# Patient Record
Sex: Male | Born: 1945 | Race: White | Hispanic: No | Marital: Married | Smoking: Former smoker
Health system: Southern US, Community
[De-identification: ages and names within clinical notes are randomized; demographics above are authoritative.]

## PROBLEM LIST (undated history)

## (undated) DIAGNOSIS — R519 Headache, unspecified: Secondary | ICD-10-CM

## (undated) DIAGNOSIS — S0990XA Unspecified injury of head, initial encounter: Secondary | ICD-10-CM

## (undated) HISTORY — PX: COLONOSCOPY: SHX174

## (undated) HISTORY — PX: EYE SURGERY: SHX253

---

## 2003-07-15 ENCOUNTER — Emergency Department (HOSPITAL_COMMUNITY): Admission: EM | Admit: 2003-07-15 | Discharge: 2003-07-15 | Payer: Self-pay | Admitting: Emergency Medicine

## 2003-07-16 ENCOUNTER — Ambulatory Visit (HOSPITAL_COMMUNITY): Admission: RE | Admit: 2003-07-16 | Discharge: 2003-07-16 | Payer: Self-pay | Admitting: Obstetrics and Gynecology

## 2006-04-24 ENCOUNTER — Ambulatory Visit (HOSPITAL_COMMUNITY): Admission: RE | Admit: 2006-04-24 | Discharge: 2006-04-24 | Payer: Self-pay | Admitting: Internal Medicine

## 2011-04-25 ENCOUNTER — Other Ambulatory Visit (HOSPITAL_COMMUNITY): Payer: Self-pay | Admitting: Neurosurgery

## 2011-04-25 DIAGNOSIS — M545 Low back pain: Secondary | ICD-10-CM

## 2011-04-28 ENCOUNTER — Ambulatory Visit (HOSPITAL_COMMUNITY)
Admission: RE | Admit: 2011-04-28 | Discharge: 2011-04-28 | Disposition: A | Discharge status: Home / Self Care | Payer: Medicare Other | Source: Ambulatory Visit | Attending: Neurosurgery | Admitting: Neurosurgery

## 2011-04-28 DIAGNOSIS — M5126 Other intervertebral disc displacement, lumbar region: Secondary | ICD-10-CM | POA: Insufficient documentation

## 2011-04-28 DIAGNOSIS — M51379 Other intervertebral disc degeneration, lumbosacral region without mention of lumbar back pain or lower extremity pain: Secondary | ICD-10-CM | POA: Insufficient documentation

## 2011-04-28 DIAGNOSIS — M5137 Other intervertebral disc degeneration, lumbosacral region: Secondary | ICD-10-CM | POA: Insufficient documentation

## 2011-04-28 DIAGNOSIS — M545 Low back pain, unspecified: Secondary | ICD-10-CM | POA: Insufficient documentation

## 2019-09-13 DIAGNOSIS — J189 Pneumonia, unspecified organism: Secondary | ICD-10-CM

## 2019-09-13 HISTORY — DX: Pneumonia, unspecified organism: J18.9

## 2019-09-26 DIAGNOSIS — Z0000 Encounter for general adult medical examination without abnormal findings: Secondary | ICD-10-CM | POA: Diagnosis not present

## 2019-09-26 DIAGNOSIS — E782 Mixed hyperlipidemia: Secondary | ICD-10-CM | POA: Diagnosis not present

## 2019-09-26 DIAGNOSIS — R7301 Impaired fasting glucose: Secondary | ICD-10-CM | POA: Diagnosis not present

## 2019-09-26 DIAGNOSIS — R030 Elevated blood-pressure reading, without diagnosis of hypertension: Secondary | ICD-10-CM | POA: Diagnosis not present

## 2019-09-26 DIAGNOSIS — H6123 Impacted cerumen, bilateral: Secondary | ICD-10-CM | POA: Diagnosis not present

## 2019-09-26 DIAGNOSIS — Z85828 Personal history of other malignant neoplasm of skin: Secondary | ICD-10-CM | POA: Diagnosis not present

## 2019-09-26 DIAGNOSIS — N401 Enlarged prostate with lower urinary tract symptoms: Secondary | ICD-10-CM | POA: Diagnosis not present

## 2019-09-26 DIAGNOSIS — N529 Male erectile dysfunction, unspecified: Secondary | ICD-10-CM | POA: Diagnosis not present

## 2019-09-26 DIAGNOSIS — R438 Other disturbances of smell and taste: Secondary | ICD-10-CM | POA: Diagnosis not present

## 2019-10-04 ENCOUNTER — Ambulatory Visit: Payer: Medicare Other | Attending: Internal Medicine

## 2019-10-04 DIAGNOSIS — Z23 Encounter for immunization: Secondary | ICD-10-CM | POA: Insufficient documentation

## 2019-10-04 NOTE — Progress Notes (Signed)
   Covid-19 Vaccination Clinic  Name:  JOBANNY MAVIS    MRN: [id number] DOB: Dec 01, 1945  10/04/2019  Mr. Greenlaw was observed post Covid-19 immunization for 15 minutes without incidence. He was provided with Vaccine Information Sheet and instruction to access the V-Safe system.   Mr. Patras was instructed to call 911 with any severe reactions post vaccine: Marland Kitchen Difficulty breathing  . Swelling of your face and throat  . A fast heartbeat  . A bad rash all over your body  . Dizziness and weakness    Immunizations Administered    Name Date Dose VIS Date Route   Pfizer COVID-19 Vaccine 10/04/2019 11:10 AM 0.3 mL 08/23/2019 Intramuscular   Manufacturer: [employer], [employer]   Lot: [id number]   NDC: [address]-8

## 2019-10-25 ENCOUNTER — Ambulatory Visit: Payer: Medicare PPO | Attending: Internal Medicine

## 2019-10-25 DIAGNOSIS — Z23 Encounter for immunization: Secondary | ICD-10-CM | POA: Insufficient documentation

## 2019-10-25 NOTE — Progress Notes (Signed)
   Covid-19 Vaccination Clinic  Name:  Luis Carr    MRN: [id number] DOB: 05-Feb-1946  10/25/2019  [name] was observed post Covid-19 immunization for 15 minutes without incidence. He was provided with Vaccine Information Sheet and instruction to access the V-Safe system.   [name] was instructed to call 911 with any severe reactions post vaccine: Marland Kitchen Difficulty breathing  . Swelling of your face and throat  . A fast heartbeat  . A bad rash all over your body  . Dizziness and weakness    Immunizations Administered    Name Date Dose VIS Date Route   Pfizer COVID-19 Vaccine 10/25/2019 11:05 AM 0.3 mL 08/23/2019 Intramuscular   Manufacturer: [employer], [employer]   Lot: [id number]   NDC: [address]-3

## 2020-01-31 DIAGNOSIS — M545 Low back pain: Secondary | ICD-10-CM | POA: Diagnosis not present

## 2020-01-31 DIAGNOSIS — N529 Male erectile dysfunction, unspecified: Secondary | ICD-10-CM | POA: Diagnosis not present

## 2020-01-31 DIAGNOSIS — H6123 Impacted cerumen, bilateral: Secondary | ICD-10-CM | POA: Diagnosis not present

## 2020-01-31 DIAGNOSIS — E782 Mixed hyperlipidemia: Secondary | ICD-10-CM | POA: Diagnosis not present

## 2020-01-31 DIAGNOSIS — R030 Elevated blood-pressure reading, without diagnosis of hypertension: Secondary | ICD-10-CM | POA: Diagnosis not present

## 2020-01-31 DIAGNOSIS — Z0000 Encounter for general adult medical examination without abnormal findings: Secondary | ICD-10-CM | POA: Diagnosis not present

## 2020-01-31 DIAGNOSIS — R438 Other disturbances of smell and taste: Secondary | ICD-10-CM | POA: Diagnosis not present

## 2020-01-31 DIAGNOSIS — N401 Enlarged prostate with lower urinary tract symptoms: Secondary | ICD-10-CM | POA: Diagnosis not present

## 2020-02-04 DIAGNOSIS — N529 Male erectile dysfunction, unspecified: Secondary | ICD-10-CM | POA: Diagnosis not present

## 2020-02-04 DIAGNOSIS — Z6825 Body mass index (BMI) 25.0-25.9, adult: Secondary | ICD-10-CM | POA: Diagnosis not present

## 2020-02-04 DIAGNOSIS — M545 Low back pain: Secondary | ICD-10-CM | POA: Diagnosis not present

## 2020-02-04 DIAGNOSIS — E782 Mixed hyperlipidemia: Secondary | ICD-10-CM | POA: Diagnosis not present

## 2020-02-04 DIAGNOSIS — N401 Enlarged prostate with lower urinary tract symptoms: Secondary | ICD-10-CM | POA: Diagnosis not present

## 2020-02-04 DIAGNOSIS — R7301 Impaired fasting glucose: Secondary | ICD-10-CM | POA: Diagnosis not present

## 2020-02-04 DIAGNOSIS — Z0001 Encounter for general adult medical examination with abnormal findings: Secondary | ICD-10-CM | POA: Diagnosis not present

## 2020-02-04 DIAGNOSIS — Z85828 Personal history of other malignant neoplasm of skin: Secondary | ICD-10-CM | POA: Diagnosis not present

## 2020-02-04 DIAGNOSIS — R030 Elevated blood-pressure reading, without diagnosis of hypertension: Secondary | ICD-10-CM | POA: Diagnosis not present

## 2020-04-12 ENCOUNTER — Emergency Department (HOSPITAL_COMMUNITY)
Admission: EM | Admit: 2020-04-12 | Discharge: 2020-04-12 | Disposition: A | Discharge status: Home / Self Care | Payer: Medicare PPO | Attending: Emergency Medicine | Admitting: Emergency Medicine

## 2020-04-12 ENCOUNTER — Emergency Department (HOSPITAL_COMMUNITY): Payer: Medicare PPO

## 2020-04-12 ENCOUNTER — Other Ambulatory Visit: Payer: Self-pay

## 2020-04-12 DIAGNOSIS — Z5321 Procedure and treatment not carried out due to patient leaving prior to being seen by health care provider: Secondary | ICD-10-CM | POA: Diagnosis not present

## 2020-04-12 DIAGNOSIS — R05 Cough: Secondary | ICD-10-CM | POA: Diagnosis not present

## 2020-04-12 DIAGNOSIS — R079 Chest pain, unspecified: Secondary | ICD-10-CM | POA: Insufficient documentation

## 2020-04-12 DIAGNOSIS — J189 Pneumonia, unspecified organism: Secondary | ICD-10-CM | POA: Diagnosis not present

## 2020-04-12 LAB — CBC
HCT: 42.5 % (ref 39.0–52.0)
Hemoglobin: 14.4 g/dL (ref 13.0–17.0)
MCH: 31 pg (ref 26.0–34.0)
MCHC: 33.9 g/dL (ref 30.0–36.0)
MCV: 91.4 fL (ref 80.0–100.0)
Platelets: 306 10*3/uL (ref 150–400)
RBC: 4.65 MIL/uL (ref 4.22–5.81)
RDW: 13.1 % (ref 11.5–15.5)
WBC: 5.7 10*3/uL (ref 4.0–10.5)
nRBC: 0 % (ref 0.0–0.2)

## 2020-04-12 LAB — BASIC METABOLIC PANEL
Anion gap: 9 (ref 5–15)
BUN: 17 mg/dL (ref 8–23)
CO2: 26 mmol/L (ref 22–32)
Calcium: 9.3 mg/dL (ref 8.9–10.3)
Chloride: 104 mmol/L (ref 98–111)
Creatinine, Ser: 0.78 mg/dL (ref 0.61–1.24)
GFR calc Af Amer: >60 mL/min (ref >60)
GFR calc non Af Amer: >60 mL/min (ref >60)
Glucose, Bld: 125 mg/dL — ABNORMAL HIGH (ref 70–99)
Potassium: 4 mmol/L (ref 3.5–5.1)
Sodium: 139 mmol/L (ref 135–145)

## 2020-04-12 LAB — TROPONIN I (HIGH SENSITIVITY): Troponin I (High Sensitivity): 4 ng/L (ref <18)

## 2020-04-12 NOTE — ED Triage Notes (Signed)
Per patient, he developed central chest pain around 2 hours ago. Denies radiation. Patient says he has been coughing up white frothy sputum also. Pain rated 10/10

## 2020-04-14 DIAGNOSIS — J189 Pneumonia, unspecified organism: Secondary | ICD-10-CM | POA: Diagnosis not present

## 2020-04-16 DIAGNOSIS — J189 Pneumonia, unspecified organism: Secondary | ICD-10-CM | POA: Diagnosis not present

## 2020-04-16 DIAGNOSIS — R21 Rash and other nonspecific skin eruption: Secondary | ICD-10-CM | POA: Diagnosis not present

## 2020-04-16 DIAGNOSIS — J690 Pneumonitis due to inhalation of food and vomit: Secondary | ICD-10-CM | POA: Diagnosis not present

## 2020-05-07 ENCOUNTER — Other Ambulatory Visit: Payer: Self-pay

## 2020-05-07 ENCOUNTER — Other Ambulatory Visit: Payer: Self-pay | Admitting: Internal Medicine

## 2020-05-07 ENCOUNTER — Ambulatory Visit (HOSPITAL_COMMUNITY)
Admission: RE | Admit: 2020-05-07 | Discharge: 2020-05-07 | Disposition: A | Discharge status: Home / Self Care | Payer: Medicare PPO | Source: Ambulatory Visit | Attending: Internal Medicine | Admitting: Internal Medicine

## 2020-05-07 ENCOUNTER — Other Ambulatory Visit (HOSPITAL_COMMUNITY): Payer: Self-pay | Admitting: Internal Medicine

## 2020-05-07 DIAGNOSIS — Z8701 Personal history of pneumonia (recurrent): Secondary | ICD-10-CM | POA: Diagnosis not present

## 2020-05-07 DIAGNOSIS — R05 Cough: Secondary | ICD-10-CM | POA: Insufficient documentation

## 2020-05-07 DIAGNOSIS — R059 Cough, unspecified: Secondary | ICD-10-CM

## 2020-05-07 DIAGNOSIS — R918 Other nonspecific abnormal finding of lung field: Secondary | ICD-10-CM | POA: Diagnosis not present

## 2020-05-14 DIAGNOSIS — R918 Other nonspecific abnormal finding of lung field: Secondary | ICD-10-CM | POA: Diagnosis not present

## 2020-05-14 DIAGNOSIS — R490 Dysphonia: Secondary | ICD-10-CM | POA: Diagnosis not present

## 2020-05-14 DIAGNOSIS — J690 Pneumonitis due to inhalation of food and vomit: Secondary | ICD-10-CM | POA: Diagnosis not present

## 2020-05-15 ENCOUNTER — Other Ambulatory Visit (HOSPITAL_COMMUNITY): Payer: Self-pay | Admitting: Internal Medicine

## 2020-05-15 DIAGNOSIS — R059 Cough, unspecified: Secondary | ICD-10-CM

## 2020-05-21 ENCOUNTER — Other Ambulatory Visit: Payer: Self-pay | Admitting: Internal Medicine

## 2020-05-21 ENCOUNTER — Other Ambulatory Visit (HOSPITAL_COMMUNITY): Payer: Self-pay | Admitting: Internal Medicine

## 2020-05-21 DIAGNOSIS — R918 Other nonspecific abnormal finding of lung field: Secondary | ICD-10-CM

## 2020-05-29 ENCOUNTER — Ambulatory Visit (HOSPITAL_COMMUNITY)
Admission: RE | Admit: 2020-05-29 | Discharge: 2020-05-29 | Disposition: A | Discharge status: Home / Self Care | Payer: Medicare PPO | Source: Ambulatory Visit | Attending: Internal Medicine | Admitting: Internal Medicine

## 2020-05-29 ENCOUNTER — Other Ambulatory Visit: Payer: Self-pay

## 2020-05-29 DIAGNOSIS — I2510 Atherosclerotic heart disease of native coronary artery without angina pectoris: Secondary | ICD-10-CM | POA: Diagnosis not present

## 2020-05-29 DIAGNOSIS — R918 Other nonspecific abnormal finding of lung field: Secondary | ICD-10-CM

## 2020-05-29 DIAGNOSIS — I700 Atherosclerosis of aorta: Secondary | ICD-10-CM | POA: Diagnosis not present

## 2020-05-29 DIAGNOSIS — K449 Diaphragmatic hernia without obstruction or gangrene: Secondary | ICD-10-CM | POA: Diagnosis not present

## 2020-06-12 ENCOUNTER — Encounter: Payer: Medicare PPO | Admitting: Thoracic Surgery (Cardiothoracic Vascular Surgery)

## 2020-06-19 ENCOUNTER — Telehealth: Payer: Self-pay | Admitting: *Deleted

## 2020-06-19 NOTE — Telephone Encounter (Signed)
left vm to call our office to r/s missed appointment

## 2020-06-26 ENCOUNTER — Other Ambulatory Visit: Payer: Self-pay

## 2020-06-26 ENCOUNTER — Institutional Professional Consult (permissible substitution): Payer: Medicare PPO | Admitting: Thoracic Surgery (Cardiothoracic Vascular Surgery)

## 2020-06-26 ENCOUNTER — Encounter: Payer: Self-pay | Admitting: Thoracic Surgery (Cardiothoracic Vascular Surgery)

## 2020-06-26 VITALS — BP 157/77 | HR 77 | Resp 18 | Ht 73.0 in | Wt 184.6 lb

## 2020-06-26 DIAGNOSIS — K449 Diaphragmatic hernia without obstruction or gangrene: Secondary | ICD-10-CM | POA: Diagnosis not present

## 2020-06-26 NOTE — Progress Notes (Signed)
[address]       [place] [address]             [phone]                    [facility] Medical Record #[id number] Date of Birth: 07-Apr-1946  Referring: Benita Stabile, MD Primary Care: Benita Stabile, MD Primary Cardiologist: No primary care provider on file.  Chief Complaint:    Chief Complaint  Patient presents with  . Diaphragmatic Hernia    Surgical consult, Chest CT 05/31/2020    History of Present Illness:    Luis Carr 74 y.o. male presents for surgical evaluation of a right diaphragmatic hernia.  This was found incidentally.  In August of this year he was treated in the emergency department for a presumed pneumonia and chest x-ray showed a right-sided opacity.  He subsequently underwent a CT scan a month later and that showed right-sided diaphragmatic hernia with intestine herniating into his right thoracic space.  From a symptomatic standpoint he denies any chest or back pain.  He denies any abdominal pain no constipation.  He also denies any nausea or vomiting.  He has no recent or remote history of any blunt or penetrating trauma outside of a ground-level fall that he experienced in 1994.  He states that he fell on his sacrum but required no surgical intervention for that.  From a respiratory standpoint he has had some increased sputum production this is persisted since his pneumonia.    No past medical history on file.   No family history on file.   Social History   Tobacco Use  Smoking Status Not on file    Social History   Substance and Sexual Activity  Alcohol Use Not on file     No Known Allergies  Current Outpatient Medications  Medication Sig Dispense Refill  . amoxicillin-clavulanate (AUGMENTIN) 875-125 MG tablet Take 1 tablet by mouth 2 (two) times daily.    . Coenzyme Q-10 200 MG CAPS Take 200 mg by mouth.    . finasteride (PROSCAR) 5 MG tablet     . Omega-3 Fatty Acids (FISH OIL) 1000 MG CAPS  Take 1,000 mg by mouth once.    . pravastatin (PRAVACHOL) 10 MG tablet Take 10 mg by mouth daily.    . sildenafil (VIAGRA) 100 MG tablet Take 100 mg by mouth daily as needed for erectile dysfunction.    . tamsulosin (FLOMAX) 0.4 MG CAPS capsule      No current facility-administered medications for this visit.    Review of Systems  Constitutional: Negative.   Respiratory: Positive for sputum production.   Cardiovascular: Negative.   Gastrointestinal: Negative.   Neurological: Negative.     PHYSICAL EXAMINATION: BP (!) 157/77 (BP Location: Right Arm, Patient Position: Sitting)   Pulse 77   Resp 18   Ht 6\' 1"  (1.854 m)   Wt 184 lb 9.6 oz (83.7 kg)   SpO2 94% Comment: RA with mask on  BMI 24.36 kg/m   Physical Exam Constitutional:      General: He is not in acute distress.    Appearance: Normal appearance. He is not ill-appearing, toxic-appearing or diaphoretic.  Eyes:     Extraocular Movements: Extraocular movements intact.     Conjunctiva/sclera: Conjunctivae normal.  Cardiovascular:     Rate and Rhythm: Normal rate.  Pulmonary:     Effort: Pulmonary effort is normal. No  respiratory distress.  Musculoskeletal:        General: Normal range of motion.     Cervical back: Normal range of motion.  Neurological:     General: No focal deficit present.     Mental Status: He is alert and oriented to person, place, and time.      Diagnostic Studies & Laboratory data:     Recent Radiology Findings:   CT CHEST WO CONTRAST  Result Date: 05/31/2020 CLINICAL DATA:  Abnormal finding on chest x-ray right lung base. EXAM: CT CHEST WITHOUT CONTRAST TECHNIQUE: Multidetector CT imaging of the chest was performed following the standard protocol without IV contrast. COMPARISON:  Chest x-ray 05/07/2020 and chest CT 07/16/2003 FINDINGS: Cardiovascular: Heart is normal size. Calcified plaque is present over the left anterior descending and right coronary arteries. Thoracic aorta is normal in  caliber. There is mild calcified plaque over the descending thoracic aorta. Remaining vascular structures are unremarkable. Mediastinum/Nodes: No significant hilar or mediastinal adenopathy. Remaining mediastinal structures are normal. Lungs/Pleura: Left lung is normally inflated. Mild volume loss of the right lung due to anterior right diaphragmatic hernia containing loop of colon. This accounts for the chest radiograph abnormality. No underlying pulmonary disease in the right lung base. No airspace process or effusion. Airways are normal. Upper Abdomen: Calcified plaque over the abdominal aorta. No acute findings. Musculoskeletal: Degenerative change of the spine. IMPRESSION: 1. No acute cardiopulmonary disease. 2. Anterior right diaphragmatic hernia containing loop of colon with associated mild volume loss of the right lung. This accounts for the chest radiograph abnormality. 3. Aortic atherosclerosis. Atherosclerotic coronary artery disease. Aortic Atherosclerosis (ICD10-I70.0). Electronically Signed   By: Elberta Fortis M.D.   On: 05/31/2020 10:45       I have independently reviewed the above radiology studies  and reviewed the findings with the patient.   Recent Lab Findings: Lab Results  Component Value Date   WBC 5.7 04/12/2020   HGB 14.4 04/12/2020   HCT 42.5 04/12/2020   PLT 306 04/12/2020   GLUCOSE 125 (H) 04/12/2020   NA 139 04/12/2020   K 4.0 04/12/2020   CL 104 04/12/2020   CREATININE 0.78 04/12/2020   BUN 17 04/12/2020   CO2 26 04/12/2020       Problem List: Diaphragmatic hernia Incarcerated bowel in the right chest.  Assessment / Plan:   This is a 74 year old male that presents with a diaphragmatic hernia along with incarcerated bowel into the right chest.  I personally reviewed his scan from September 2021 as well as a CT scan from 2004.  On the 2004 scan there appeared to be a right-sided opacity consistent with omentum that was herniated through a potential  diaphragmatic defect.  On the most recent scan of this year the hernia is much bigger with a good component of bowel herniating into his right chest.  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  He currently is asymptomatic but given the fact that this hernia is enlarging over the last 17 years I have recommended that he undergo a robotic assisted laparoscopy with the diaphragmatic hernia repair.  We discussed the potential of requiring mesh for the closure.  He would like some time to discuss surgical options with his wife.  He will call us back to let us know his final decision.      [name] 06/26/2020 3:46 PM

## 2020-06-29 ENCOUNTER — Other Ambulatory Visit: Payer: Self-pay | Admitting: *Deleted

## 2020-06-29 DIAGNOSIS — K449 Diaphragmatic hernia without obstruction or gangrene: Secondary | ICD-10-CM

## 2020-07-02 ENCOUNTER — Encounter (HOSPITAL_COMMUNITY): Payer: Self-pay

## 2020-07-02 ENCOUNTER — Other Ambulatory Visit: Payer: Self-pay

## 2020-07-02 ENCOUNTER — Encounter (HOSPITAL_COMMUNITY)
Admission: RE | Admit: 2020-07-02 | Discharge: 2020-07-02 | Disposition: A | Discharge status: Home / Self Care | Payer: Medicare PPO | Source: Ambulatory Visit | Attending: Thoracic Surgery (Cardiothoracic Vascular Surgery) | Admitting: Thoracic Surgery (Cardiothoracic Vascular Surgery)

## 2020-07-02 ENCOUNTER — Ambulatory Visit (HOSPITAL_COMMUNITY)
Admission: RE | Admit: 2020-07-02 | Discharge: 2020-07-02 | Disposition: A | Discharge status: Home / Self Care | Payer: Medicare PPO | Source: Ambulatory Visit | Attending: Thoracic Surgery (Cardiothoracic Vascular Surgery) | Admitting: Thoracic Surgery (Cardiothoracic Vascular Surgery)

## 2020-07-02 ENCOUNTER — Other Ambulatory Visit (HOSPITAL_COMMUNITY)
Admission: RE | Admit: 2020-07-02 | Discharge: 2020-07-02 | Disposition: A | Discharge status: Home / Self Care | Payer: Medicare PPO | Source: Ambulatory Visit | Attending: Thoracic Surgery (Cardiothoracic Vascular Surgery) | Admitting: Thoracic Surgery (Cardiothoracic Vascular Surgery)

## 2020-07-02 DIAGNOSIS — K449 Diaphragmatic hernia without obstruction or gangrene: Secondary | ICD-10-CM | POA: Insufficient documentation

## 2020-07-02 DIAGNOSIS — Z20822 Contact with and (suspected) exposure to covid-19: Secondary | ICD-10-CM | POA: Diagnosis not present

## 2020-07-02 DIAGNOSIS — R918 Other nonspecific abnormal finding of lung field: Secondary | ICD-10-CM | POA: Diagnosis not present

## 2020-07-02 HISTORY — DX: Unspecified injury of head, initial encounter: S09.90XA

## 2020-07-02 HISTORY — DX: Headache, unspecified: R51.9

## 2020-07-02 LAB — CBC
HCT: 41.2 % (ref 39.0–52.0)
Hemoglobin: 13.9 g/dL (ref 13.0–17.0)
MCH: 31.6 pg (ref 26.0–34.0)
MCHC: 33.7 g/dL (ref 30.0–36.0)
MCV: 93.6 fL (ref 80.0–100.0)
Platelets: 241 10*3/uL (ref 150–400)
RBC: 4.4 MIL/uL (ref 4.22–5.81)
RDW: 13.3 % (ref 11.5–15.5)
WBC: 4.5 10*3/uL (ref 4.0–10.5)
nRBC: 0 % (ref 0.0–0.2)

## 2020-07-02 LAB — URINALYSIS, ROUTINE W REFLEX MICROSCOPIC
Bilirubin Urine: NEGATIVE
Glucose, UA: NEGATIVE mg/dL
Hgb urine dipstick: NEGATIVE
Ketones, ur: NEGATIVE mg/dL
Leukocytes,Ua: NEGATIVE
Nitrite: NEGATIVE
Protein, ur: NEGATIVE mg/dL
Specific Gravity, Urine: 1.019 (ref 1.005–1.030)
pH: 5 (ref 5.0–8.0)

## 2020-07-02 LAB — COMPREHENSIVE METABOLIC PANEL
ALT: 15 U/L (ref 0–44)
AST: 25 U/L (ref 15–41)
Albumin: 4 g/dL (ref 3.5–5.0)
Alkaline Phosphatase: 44 U/L (ref 38–126)
Anion gap: 11 (ref 5–15)
BUN: 15 mg/dL (ref 8–23)
CO2: 20 mmol/L — ABNORMAL LOW (ref 22–32)
Calcium: 9.1 mg/dL (ref 8.9–10.3)
Chloride: 106 mmol/L (ref 98–111)
Creatinine, Ser: 0.81 mg/dL (ref 0.61–1.24)
GFR, Estimated: >60 mL/min (ref >60)
Glucose, Bld: 123 mg/dL — ABNORMAL HIGH (ref 70–99)
Potassium: 4 mmol/L (ref 3.5–5.1)
Sodium: 137 mmol/L (ref 135–145)
Total Bilirubin: 1.3 mg/dL — ABNORMAL HIGH (ref 0.3–1.2)
Total Protein: 6.8 g/dL (ref 6.5–8.1)

## 2020-07-02 LAB — BLOOD GAS, ARTERIAL
Acid-base deficit: 1.1 mmol/L (ref 0.0–2.0)
Bicarbonate: 22.7 mmol/L (ref 20.0–28.0)
FIO2: 21
O2 Saturation: 97.4 %
Patient temperature: 37
pCO2 arterial: 35 mmHg (ref 32.0–48.0)
pH, Arterial: 7.427 (ref 7.350–7.450)
pO2, Arterial: 94.3 mmHg (ref 83.0–108.0)

## 2020-07-02 LAB — SARS CORONAVIRUS 2 (TAT 6-24 HRS): SARS Coronavirus 2: NEGATIVE

## 2020-07-02 LAB — SURGICAL PCR SCREEN
MRSA, PCR: NEGATIVE
Staphylococcus aureus: NEGATIVE

## 2020-07-02 LAB — TYPE AND SCREEN
ABO/RH(D): O POS
Antibody Screen: NEGATIVE

## 2020-07-02 LAB — PROTIME-INR
INR: 1 (ref 0.8–1.2)
Prothrombin Time: 13 seconds (ref 11.4–15.2)

## 2020-07-02 LAB — APTT: aPTT: 29 seconds (ref 24–36)

## 2020-07-02 NOTE — Pre-Procedure Instructions (Signed)
Luis Carr  07/02/2020       Your procedure is scheduled on Monday, October 25.  Report to [facility], Main Entrance or Entrance "A" at 5:30 A.M.                Your surgery or procedure is scheduled to begin at 7:30 AM   Call this number if you have problems the morning of surgery: [phone]  This is the number for the Pre- Surgical Desk.                For any other questions, please call [phone], Monday - Friday 8 AM - 4 PM.   Remember:  Do not eat or drink after midnight Sunday night.   Take these medicines the morning of surgery with A SIP OF WATER: finasteride (PROSCAR)   1 Week prior to surgery STOP taking Aspirin, Aspirin Products (Goody Powder, Excedrin Migraine), Ibuprofen (Advil), Naproxen (Aleve), Vitamins and Herbal Products (ie Fish Oil).  STOP taking Aspirin, Aspirin Products (Goody Powder, Excedrin Migraine), Ibuprofen (Advil), Naproxen (Aleve), Vitamins and Herbal Products (ie Fish Oil).    Special instructions:    [place]- Preparing For Surgery  Before surgery, you can play an important role. Because skin is not sterile, your skin needs to be as free of germs as possible. You can reduce the number of germs on your skin by washing with CHG (chlorahexidine gluconate) Soap before surgery.  CHG is an antiseptic cleaner which kills germs and bonds with the skin to continue killing germs even after washing.    Oral Hygiene is also important to reduce your risk of infection.  Remember - BRUSH YOUR TEETH THE MORNING OF SURGERY WITH YOUR REGULAR TOOTHPASTE  Please do not use if you have an allergy to CHG or antibacterial soaps. If your skin becomes reddened/irritated stop using the CHG.  Do not shave (including legs and underarms) for at least 48 hours prior to first CHG shower. It is OK to shave your face.  Please follow these instructions carefully.   1. Shower the NIGHT BEFORE SURGERY and the MORNING OF SURGERY with CHG.   2. If you  chose to wash your hair, wash your hair first as usual with your normal shampoo.  3. After you shampoo, wash your face and private area with the soap you use at home, then rinse your hair and body thoroughly to remove the shampoo and soap.  4. Use CHG as you would any other liquid soap. You can apply CHG directly to the skin and wash gently with a scrungie or a clean washcloth.   5. Apply the CHG Soap to your body ONLY FROM THE NECK DOWN.  Do not use on open wounds or open sores. Avoid contact with your eyes, ears, mouth and genitals (private parts).   6. Wash thoroughly, paying special attention to the area where your surgery will be performed.  7. Thoroughly rinse your body with warm water from the neck down.  8. DO NOT shower/wash with your normal soap after using and rinsing off the CHG Soap.  9. Pat yourself dry with a CLEAN TOWEL.  10. Wear CLEAN PAJAMAS to bed the night before surgery, wear comfortable clothes the morning of surgery  11. Place CLEAN SHEETS on your bed the night of your first shower and DO NOT SLEEP WITH PETS.  Day of Surgery: Shower as instructed above. Do not apply any deodorants/lotions, powders or colognes.  Please wear clean  clothes to the hospital/surgery center.   Remember to brush your teeth WITH YOUR REGULAR TOOTHPASTE.  Do not wear jewelry, make-up or nail polish.  Do not shave 48 hours prior to surgery.  Men may shave face and neck.  Do not bring valuables to the hospital.  [facility] is not responsible for any belongings or valuables.  Contacts, dentures or bridgework may not be worn into surgery.  Leave your suitcase in the car.  After surgery it may be brought to your room.  For patients admitted to the hospital, discharge time will be determined by your treatment team.  Patients discharged the day of surgery will not be allowed to drive home.   Please read over the fact sheets that you were given.

## 2020-07-02 NOTE — Progress Notes (Signed)
PCP - Dr, Evette Doffing  Cardiologist -   Chest x-ray - 07/02/20  EKG - 07/02/20  Stress Test -    ECHO -   Cardiac Cath -   Sleep Study - no CPAP - no  LABS-CBC, CMP, PT, PTT,  UA, T/S, PCR.  ASA- last dose 06/24/20  ERAS-no  HA1C-NA Fasting Blood Sugar - NA Checks Blood Sugar NA times a day  Anesthesia-  Pt denies having chest pain, sob, or fever at this time. All instructions explained to the pt, with a verbal understanding of the material. Pt agrees to go over the instructions while at home for a better understanding. Pt also instructed to self quarantine after being tested for COVID-19. The opportunity to ask questions was provided.

## 2020-07-05 NOTE — Anesthesia Preprocedure Evaluation (Addendum)
Anesthesia Evaluation  Patient identified by MRN, date of birth, ID band Patient awake    Reviewed: Allergy & Precautions, NPO status , Patient's Chart, lab work & pertinent test results  Airway Mallampati: II  TM Distance: >3 FB Neck ROM: Full    Dental  (+) Dental Advisory Given   Pulmonary former smoker,    breath sounds clear to auscultation       Cardiovascular negative cardio ROS   Rhythm:Regular Rate:Normal     Neuro/Psych negative neurological ROS     GI/Hepatic negative GI ROS, Neg liver ROS,   Endo/Other  negative endocrine ROS  Renal/GU negative Renal ROS     Musculoskeletal   Abdominal   Peds  Hematology negative hematology ROS (+)   Anesthesia Other Findings   Reproductive/Obstetrics                            Lab Results  Component Value Date   WBC 4.5 07/02/2020   HGB 13.9 07/02/2020   HCT 41.2 07/02/2020   MCV 93.6 07/02/2020   PLT 241 07/02/2020   Lab Results  Component Value Date   CREATININE 0.81 07/02/2020   BUN 15 07/02/2020   NA 137 07/02/2020   K 4.0 07/02/2020   CL 106 07/02/2020   CO2 20 (L) 07/02/2020    Anesthesia Physical Anesthesia Plan  ASA: I  Anesthesia Plan: General   Post-op Pain Management:    Induction: Intravenous  PONV Risk Score and Plan: 3 and Dexamethasone, Ondansetron and Treatment may vary due to age or medical condition  Airway Management Planned: Oral ETT and Double Lumen EBT  Additional Equipment: Arterial line  Intra-op Plan:   Post-operative Plan: Extubation in OR  Informed Consent: I have reviewed the patients History and Physical, chart, labs and discussed the procedure including the risks, benefits and alternatives for the proposed anesthesia with the patient or authorized representative who has indicated his/her understanding and acceptance.     Dental advisory given  Plan Discussed with:  CRNA  Anesthesia Plan Comments:        Anesthesia Quick Evaluation

## 2020-07-06 ENCOUNTER — Other Ambulatory Visit: Payer: Self-pay

## 2020-07-06 ENCOUNTER — Ambulatory Visit (HOSPITAL_COMMUNITY): Payer: Medicare PPO | Admitting: Anesthesiology

## 2020-07-06 ENCOUNTER — Observation Stay (HOSPITAL_COMMUNITY): Payer: Medicare PPO

## 2020-07-06 ENCOUNTER — Encounter (HOSPITAL_COMMUNITY): Payer: Self-pay | Admitting: Thoracic Surgery (Cardiothoracic Vascular Surgery)

## 2020-07-06 ENCOUNTER — Encounter (HOSPITAL_COMMUNITY)
Admission: RE | Disposition: A | Discharge status: Home / Self Care | Payer: Self-pay | Source: Home / Self Care | Attending: Thoracic Surgery (Cardiothoracic Vascular Surgery)

## 2020-07-06 ENCOUNTER — Observation Stay (HOSPITAL_COMMUNITY)
Admission: RE | Admit: 2020-07-06 | Discharge: 2020-07-07 | Disposition: A | Discharge status: Home / Self Care | Payer: Medicare PPO | Attending: Thoracic Surgery (Cardiothoracic Vascular Surgery) | Admitting: Thoracic Surgery (Cardiothoracic Vascular Surgery)

## 2020-07-06 DIAGNOSIS — J9811 Atelectasis: Secondary | ICD-10-CM | POA: Diagnosis not present

## 2020-07-06 DIAGNOSIS — K449 Diaphragmatic hernia without obstruction or gangrene: Secondary | ICD-10-CM | POA: Diagnosis not present

## 2020-07-06 DIAGNOSIS — Z09 Encounter for follow-up examination after completed treatment for conditions other than malignant neoplasm: Secondary | ICD-10-CM

## 2020-07-06 DIAGNOSIS — K440 Diaphragmatic hernia with obstruction, without gangrene: Secondary | ICD-10-CM | POA: Diagnosis not present

## 2020-07-06 HISTORY — PX: XI ROBOTIC ASSISTED REPAIR OF DIAPHRAGMATIC HERNIA: SHX6865

## 2020-07-06 LAB — CBC
HCT: 37.7 % — ABNORMAL LOW (ref 39.0–52.0)
Hemoglobin: 12.7 g/dL — ABNORMAL LOW (ref 13.0–17.0)
MCH: 31.3 pg (ref 26.0–34.0)
MCHC: 33.7 g/dL (ref 30.0–36.0)
MCV: 92.9 fL (ref 80.0–100.0)
Platelets: 246 10*3/uL (ref 150–400)
RBC: 4.06 MIL/uL — ABNORMAL LOW (ref 4.22–5.81)
RDW: 13.2 % (ref 11.5–15.5)
WBC: 8.9 10*3/uL (ref 4.0–10.5)
nRBC: 0 % (ref 0.0–0.2)

## 2020-07-06 LAB — ABO/RH: ABO/RH(D): O POS

## 2020-07-06 SURGERY — REPAIR, HERNIA, DIAPHRAGMATIC, ROBOT-ASSISTED
Anesthesia: General

## 2020-07-06 MED ORDER — BUPIVACAINE LIPOSOME 1.3 % IJ SUSP
20.0000 mL | Freq: Once | INTRAMUSCULAR | Status: DC
  Filled 2020-07-06: qty 20

## 2020-07-06 MED ORDER — KETOROLAC TROMETHAMINE 30 MG/ML IJ SOLN
INTRAMUSCULAR | Status: DC | PRN
  Administered 2020-07-06: 30 mg via INTRAVENOUS

## 2020-07-06 MED ORDER — LACTATED RINGERS IV SOLN: INTRAVENOUS | Status: DC

## 2020-07-06 MED ORDER — SUGAMMADEX SODIUM 200 MG/2ML IV SOLN
INTRAVENOUS | Status: DC | PRN
  Administered 2020-07-06: 170 mg via INTRAVENOUS

## 2020-07-06 MED ORDER — AMISULPRIDE (ANTIEMETIC) 5 MG/2ML IV SOLN: 10.0000 mg | Freq: Once | INTRAVENOUS | Status: DC | PRN

## 2020-07-06 MED ORDER — KETOROLAC TROMETHAMINE 15 MG/ML IJ SOLN
15.0000 mg | Freq: Four times a day (QID) | INTRAMUSCULAR | Status: DC
  Administered 2020-07-06 – 2020-07-07 (×3): 15 mg via INTRAVENOUS
  Filled 2020-07-06 (×3): qty 1

## 2020-07-06 MED ORDER — BUPIVACAINE HCL (PF) 0.5 % IJ SOLN
INTRAMUSCULAR | Status: AC
  Filled 2020-07-06: qty 30

## 2020-07-06 MED ORDER — BUPIVACAINE LIPOSOME 1.3 % IJ SUSP
INTRAMUSCULAR | Status: DC | PRN
  Administered 2020-07-06: 50 mL

## 2020-07-06 MED ORDER — PROPOFOL 10 MG/ML IV BOLUS
INTRAVENOUS | Status: DC | PRN
  Administered 2020-07-06: 110 mg via INTRAVENOUS

## 2020-07-06 MED ORDER — ACETAMINOPHEN 500 MG PO TABS
1000.0000 mg | ORAL_TABLET | Freq: Once | ORAL | Status: AC
  Administered 2020-07-06: 1000 mg via ORAL
  Filled 2020-07-06: qty 2

## 2020-07-06 MED ORDER — ACETAMINOPHEN 500 MG PO TABS
1000.0000 mg | ORAL_TABLET | Freq: Four times a day (QID) | ORAL | Status: DC
  Administered 2020-07-06 – 2020-07-07 (×3): 1000 mg via ORAL
  Filled 2020-07-06 (×4): qty 2

## 2020-07-06 MED ORDER — ONDANSETRON HCL 4 MG/2ML IJ SOLN
INTRAMUSCULAR | Status: AC
  Filled 2020-07-06: qty 2

## 2020-07-06 MED ORDER — ONDANSETRON HCL 4 MG/2ML IJ SOLN
INTRAMUSCULAR | Status: DC | PRN
  Administered 2020-07-06: 4 mg via INTRAVENOUS

## 2020-07-06 MED ORDER — DEXAMETHASONE SODIUM PHOSPHATE 10 MG/ML IJ SOLN
INTRAMUSCULAR | Status: DC | PRN
  Administered 2020-07-06: 4 mg via INTRAVENOUS

## 2020-07-06 MED ORDER — CHLORHEXIDINE GLUCONATE 0.12 % MT SOLN
OROMUCOSAL | Status: AC
  Administered 2020-07-06: 15 mL via OROMUCOSAL
  Filled 2020-07-06: qty 15

## 2020-07-06 MED ORDER — TAMSULOSIN HCL 0.4 MG PO CAPS
0.4000 mg | ORAL_CAPSULE | Freq: Every evening | ORAL | Status: DC
  Administered 2020-07-06: 0.4 mg via ORAL
  Filled 2020-07-06: qty 1

## 2020-07-06 MED ORDER — KETOROLAC TROMETHAMINE 30 MG/ML IJ SOLN
INTRAMUSCULAR | Status: AC
  Filled 2020-07-06: qty 1

## 2020-07-06 MED ORDER — PHENYLEPHRINE HCL-NACL 10-0.9 MG/250ML-% IV SOLN
INTRAVENOUS | Status: DC | PRN
  Administered 2020-07-06: 25 ug/min via INTRAVENOUS

## 2020-07-06 MED ORDER — ENOXAPARIN SODIUM 40 MG/0.4ML ~~LOC~~ SOLN
40.0000 mg | SUBCUTANEOUS | Status: DC
  Administered 2020-07-06: 40 mg via SUBCUTANEOUS
  Filled 2020-07-06: qty 0.4

## 2020-07-06 MED ORDER — ROCURONIUM BROMIDE 10 MG/ML (PF) SYRINGE
PREFILLED_SYRINGE | INTRAVENOUS | Status: DC | PRN
  Administered 2020-07-06: 10 mg via INTRAVENOUS
  Administered 2020-07-06: 20 mg via INTRAVENOUS
  Administered 2020-07-06: 40 mg via INTRAVENOUS
  Administered 2020-07-06: 20 mg via INTRAVENOUS
  Administered 2020-07-06: 50 mg via INTRAVENOUS
  Administered 2020-07-06 (×2): 20 mg via INTRAVENOUS

## 2020-07-06 MED ORDER — ORAL CARE MOUTH RINSE: 15.0000 mL | Freq: Once | OROMUCOSAL | Status: AC

## 2020-07-06 MED ORDER — LIDOCAINE 2% (20 MG/ML) 5 ML SYRINGE
INTRAMUSCULAR | Status: AC
  Filled 2020-07-06: qty 5

## 2020-07-06 MED ORDER — CHLORHEXIDINE GLUCONATE 0.12 % MT SOLN: 15.0000 mL | Freq: Once | OROMUCOSAL | Status: AC

## 2020-07-06 MED ORDER — PHENYLEPHRINE 40 MCG/ML (10ML) SYRINGE FOR IV PUSH (FOR BLOOD PRESSURE SUPPORT)
PREFILLED_SYRINGE | INTRAVENOUS | Status: AC
  Filled 2020-07-06: qty 10

## 2020-07-06 MED ORDER — FENTANYL CITRATE (PF) 250 MCG/5ML IJ SOLN
INTRAMUSCULAR | Status: DC | PRN
  Administered 2020-07-06 (×5): 50 ug via INTRAVENOUS

## 2020-07-06 MED ORDER — TRAMADOL HCL 50 MG PO TABS: 50.0000 mg | ORAL_TABLET | Freq: Four times a day (QID) | ORAL | Status: DC | PRN

## 2020-07-06 MED ORDER — CEFAZOLIN SODIUM-DEXTROSE 2-4 GM/100ML-% IV SOLN
2.0000 g | Freq: Three times a day (TID) | INTRAVENOUS | Status: AC
  Administered 2020-07-06: 2 g via INTRAVENOUS
  Filled 2020-07-06: qty 100

## 2020-07-06 MED ORDER — FENTANYL CITRATE (PF) 100 MCG/2ML IJ SOLN: 25.0000 ug | INTRAMUSCULAR | Status: DC | PRN

## 2020-07-06 MED ORDER — CEFAZOLIN SODIUM-DEXTROSE 2-4 GM/100ML-% IV SOLN
2.0000 g | INTRAVENOUS | Status: AC
  Administered 2020-07-06: 2 g via INTRAVENOUS
  Filled 2020-07-06: qty 100

## 2020-07-06 MED ORDER — FENTANYL CITRATE (PF) 250 MCG/5ML IJ SOLN
INTRAMUSCULAR | Status: AC
  Filled 2020-07-06: qty 5

## 2020-07-06 MED ORDER — 0.9 % SODIUM CHLORIDE (POUR BTL) OPTIME
TOPICAL | Status: DC | PRN
  Administered 2020-07-06: 2000 mL

## 2020-07-06 MED ORDER — PROPOFOL 10 MG/ML IV BOLUS
INTRAVENOUS | Status: AC
  Filled 2020-07-06: qty 20

## 2020-07-06 MED ORDER — ONDANSETRON 4 MG PO TBDP: 4.0000 mg | ORAL_TABLET | Freq: Four times a day (QID) | ORAL | Status: DC | PRN

## 2020-07-06 MED ORDER — LIDOCAINE 2% (20 MG/ML) 5 ML SYRINGE
INTRAMUSCULAR | Status: DC | PRN
  Administered 2020-07-06: 60 mg via INTRAVENOUS

## 2020-07-06 MED ORDER — ROCURONIUM BROMIDE 10 MG/ML (PF) SYRINGE
PREFILLED_SYRINGE | INTRAVENOUS | Status: AC
  Filled 2020-07-06: qty 10

## 2020-07-06 MED ORDER — ALBUMIN HUMAN 5 % IV SOLN: INTRAVENOUS | Status: DC | PRN

## 2020-07-06 MED ORDER — DOCUSATE SODIUM 100 MG PO CAPS
100.0000 mg | ORAL_CAPSULE | Freq: Two times a day (BID) | ORAL | Status: DC
  Administered 2020-07-06 – 2020-07-07 (×2): 100 mg via ORAL
  Filled 2020-07-06 (×3): qty 1

## 2020-07-06 MED ORDER — CELECOXIB 200 MG PO CAPS
200.0000 mg | ORAL_CAPSULE | Freq: Once | ORAL | Status: AC
  Administered 2020-07-06: 200 mg via ORAL
  Filled 2020-07-06: qty 1

## 2020-07-06 MED ORDER — PHENYLEPHRINE 40 MCG/ML (10ML) SYRINGE FOR IV PUSH (FOR BLOOD PRESSURE SUPPORT)
PREFILLED_SYRINGE | INTRAVENOUS | Status: DC | PRN
  Administered 2020-07-06 (×10): 80 ug via INTRAVENOUS

## 2020-07-06 MED ORDER — PRAVASTATIN SODIUM 10 MG PO TABS
10.0000 mg | ORAL_TABLET | Freq: Every evening | ORAL | Status: DC
  Administered 2020-07-06: 10 mg via ORAL
  Filled 2020-07-06: qty 1

## 2020-07-06 MED ORDER — DEXAMETHASONE SODIUM PHOSPHATE 10 MG/ML IJ SOLN
INTRAMUSCULAR | Status: AC
  Filled 2020-07-06: qty 1

## 2020-07-06 MED ORDER — FINASTERIDE 5 MG PO TABS
5.0000 mg | ORAL_TABLET | Freq: Every day | ORAL | Status: DC
  Administered 2020-07-06 – 2020-07-07 (×2): 5 mg via ORAL
  Filled 2020-07-06 (×3): qty 1

## 2020-07-06 MED ORDER — LACTATED RINGERS IV SOLN: INTRAVENOUS | Status: DC | PRN

## 2020-07-06 MED ORDER — ONDANSETRON HCL 4 MG/2ML IJ SOLN: 4.0000 mg | Freq: Four times a day (QID) | INTRAMUSCULAR | Status: DC | PRN

## 2020-07-06 MED ORDER — MAGNESIUM HYDROXIDE 400 MG/5ML PO SUSP: 30.0000 mL | Freq: Every day | ORAL | Status: DC | PRN

## 2020-07-06 SURGICAL SUPPLY — 89 items
ANCHOR CATH FOLEY SECURE (MISCELLANEOUS) IMPLANT
BLADE SURG 11 STRL SS (BLADE) ×3 IMPLANT
CANISTER SUCT 3000ML PPV (MISCELLANEOUS) ×6 IMPLANT
CANNULA REDUC XI 12-8 STAPL (CANNULA)
CANNULA REDUC XI 12-8MM STAPL (CANNULA)
CANNULA REDUCER 12-8 DVNC XI (CANNULA) IMPLANT
CATH THORACIC 28FR (CATHETERS) ×3 IMPLANT
CNTNR URN SCR LID CUP LEK RST (MISCELLANEOUS) ×2 IMPLANT
CONT SPEC 4OZ STRL OR WHT (MISCELLANEOUS) ×6
DEFOGGER SCOPE WARMER CLEARIFY (MISCELLANEOUS) ×3 IMPLANT
DERMABOND ADHESIVE PROPEN (GAUZE/BANDAGES/DRESSINGS) ×2
DERMABOND ADVANCED (GAUZE/BANDAGES/DRESSINGS) ×4
DERMABOND ADVANCED .7 DNX12 (GAUZE/BANDAGES/DRESSINGS) ×2 IMPLANT
DERMABOND ADVANCED .7 DNX6 (GAUZE/BANDAGES/DRESSINGS) ×1 IMPLANT
DEVICE SECURE STRAP 25 ABSORB (INSTRUMENTS) ×3 IMPLANT
DEVICE SUTURE ENDOST 10MM (ENDOMECHANICALS) IMPLANT
DRAIN PENROSE 1/4X12 LTX STRL (WOUND CARE) ×3 IMPLANT
DRAPE COLUMN DVNC XI (DISPOSABLE) ×4 IMPLANT
DRAPE CV SPLIT W-CLR ANES SCRN (DRAPES) ×3 IMPLANT
DRAPE DA VINCI XI COLUMN (DISPOSABLE) ×12
DRAPE INCISE IOBAN 66X45 STRL (DRAPES) IMPLANT
DRAPE ORTHO SPLIT 77X108 STRL (DRAPES) ×3
DRAPE SLUSH/WARMER DISC (DRAPES) ×3 IMPLANT
DRAPE SURG ORHT 6 SPLT 77X108 (DRAPES) ×1 IMPLANT
DRAPE WARM FLUID 44X44 (DRAPES) ×3 IMPLANT
ELECT REM PT RETURN 9FT ADLT (ELECTROSURGICAL) ×3
ELECTRODE REM PT RTRN 9FT ADLT (ELECTROSURGICAL) ×1 IMPLANT
FELT TEFLON 1X6 (MISCELLANEOUS) IMPLANT
GAUZE KITTNER 4X8 (MISCELLANEOUS) ×6 IMPLANT
GAUZE SPONGE 4X4 12PLY STRL (GAUZE/BANDAGES/DRESSINGS) IMPLANT
GLOVE BIO SURGEON STRL SZ7.5 (GLOVE) ×6 IMPLANT
GOWN STRL REUS W/ TWL LRG LVL3 (GOWN DISPOSABLE) ×1 IMPLANT
GOWN STRL REUS W/ TWL XL LVL3 (GOWN DISPOSABLE) ×2 IMPLANT
GOWN STRL REUS W/TWL 2XL LVL3 (GOWN DISPOSABLE) ×3 IMPLANT
GOWN STRL REUS W/TWL LRG LVL3 (GOWN DISPOSABLE) ×3
GOWN STRL REUS W/TWL XL LVL3 (GOWN DISPOSABLE) ×6
GRASPER SUT TROCAR 14GX15 (MISCELLANEOUS) IMPLANT
HEMOSTAT SURGICEL 2X14 (HEMOSTASIS) IMPLANT
IV NS 1000ML (IV SOLUTION)
IV NS 1000ML BAXH (IV SOLUTION) IMPLANT
KIT BASIN OR (CUSTOM PROCEDURE TRAY) ×3 IMPLANT
KIT TURNOVER KIT B (KITS) ×3 IMPLANT
MATRIX SURGICAL PSMX 7X10CM (Tissue) ×3 IMPLANT
NEEDLE 18GX1X1/2 (RX/OR ONLY) (NEEDLE) IMPLANT
NS IRRIG 1000ML POUR BTL (IV SOLUTION) ×6 IMPLANT
OBTURATOR OPTICAL STANDARD 8MM (TROCAR) ×3
OBTURATOR OPTICAL STND 8 DVNC (TROCAR) ×1
OBTURATOR OPTICALSTD 8 DVNC (TROCAR) ×1 IMPLANT
PACK CHEST (CUSTOM PROCEDURE TRAY) ×3 IMPLANT
PAD ARMBOARD 7.5X6 YLW CONV (MISCELLANEOUS) ×6 IMPLANT
PORT ACCESS TROCAR AIRSEAL 12 (TROCAR) ×1 IMPLANT
PORT ACCESS TROCAR AIRSEAL 5M (TROCAR) ×2
RELOAD ENDO STITCH (ENDOMECHANICALS) ×3 IMPLANT
SEAL CANN UNIV 5-8 DVNC XI (MISCELLANEOUS) ×4 IMPLANT
SEAL XI 5MM-8MM UNIVERSAL (MISCELLANEOUS) ×12
SEALER SYNCHRO 8 IS4000 DV (MISCELLANEOUS) ×3
SEALER SYNCHRO 8 IS4000 DVNC (MISCELLANEOUS) ×1 IMPLANT
SET IRRIG TUBING LAPAROSCOPIC (IRRIGATION / IRRIGATOR) IMPLANT
SET TRI-LUMEN FLTR TB AIRSEAL (TUBING) ×3 IMPLANT
SHEET MEDIUM DRAPE 40X70 STRL (DRAPES) ×3 IMPLANT
SOL ANTI FOG 6CC (MISCELLANEOUS) IMPLANT
SOLUTION ANTI FOG 6CC (MISCELLANEOUS)
STAPLER CANNULA SEAL DVNC XI (STAPLE) ×2 IMPLANT
STAPLER CANNULA SEAL XI (STAPLE) ×6
STOPCOCK 4 WAY LG BORE MALE ST (IV SETS) ×3 IMPLANT
SUT ETHIBOND 0 36 GRN (SUTURE) ×6 IMPLANT
SUT SILK  1 MH (SUTURE) ×3
SUT SILK 1 MH (SUTURE) ×1 IMPLANT
SUT SURGIDAC NAB ES-9 0 48 120 (SUTURE) IMPLANT
SUT VIC AB 3-0 SH 27 (SUTURE)
SUT VIC AB 3-0 SH 27X BRD (SUTURE) IMPLANT
SUT VICRYL 0 UR6 27IN ABS (SUTURE) ×6 IMPLANT
SUT VLOC 180 2-0 9IN GS21 (SUTURE) ×18 IMPLANT
SYR 10ML LL (SYRINGE) ×3 IMPLANT
SYR 50ML LL SCALE MARK (SYRINGE) ×3 IMPLANT
SYSTEM RETRIEVAL ANCHOR 8 (MISCELLANEOUS) ×3 IMPLANT
SYSTEM SAHARA CHEST DRAIN ATS (WOUND CARE) ×3 IMPLANT
TAPE VIPERTRACK RADIOPAQ 30X (MISCELLANEOUS) ×1 IMPLANT
TAPE VIPERTRACK RADIOPAQUE (MISCELLANEOUS) ×3
TOWEL GREEN STERILE (TOWEL DISPOSABLE) ×3 IMPLANT
TOWEL GREEN STERILE FF (TOWEL DISPOSABLE) ×3 IMPLANT
TRAY FOLEY MTR SLVR 16FR STAT (SET/KITS/TRAYS/PACK) ×3 IMPLANT
TRAY WAYNE PNEUMOTHORAX 14X18 (TRAY / TRAY PROCEDURE) ×3 IMPLANT
TROCAR XCEL 12X100 BLDLESS (ENDOMECHANICALS) ×3 IMPLANT
TROCAR XCEL BLADELESS 5X75MML (TROCAR) IMPLANT
TROCAR XCEL NON-BLD 5MMX100MML (ENDOMECHANICALS) IMPLANT
TUBING EXTENTION W/L.L. (IV SETS) ×3 IMPLANT
TUBING LAP HI FLOW INSUFFLATIO (TUBING) ×3 IMPLANT
WATER STERILE IRR 1000ML POUR (IV SOLUTION) ×6 IMPLANT

## 2020-07-06 NOTE — Plan of Care (Signed)
  Problem: Education: Goal: Required Educational Video(s) Outcome: Progressing   Problem: Clinical Measurements: Goal: Postoperative complications will be avoided or minimized Outcome: Progressing   Problem: Skin Integrity: Goal: Demonstration of wound healing without infection will improve Outcome: Progressing   Problem: Education: Goal: Knowledge of General Education information will improve Description: Including pain rating scale, medication(s)/side effects and non-pharmacologic comfort measures Outcome: Progressing   Problem: Clinical Measurements: Goal: Will remain free from infection Outcome: Progressing   Problem: Clinical Measurements: Goal: Respiratory complications will improve Outcome: Progressing   Problem: Elimination: Goal: Will not experience complications related to bowel motility Outcome: Progressing   Problem: Pain Managment: Goal: General experience of comfort will improve Outcome: Progressing   Problem: Safety: Goal: Ability to remain free from injury will improve Outcome: Progressing   Problem: Skin Integrity: Goal: Risk for impaired skin integrity will decrease Outcome: Progressing

## 2020-07-06 NOTE — Discharge Summary (Signed)
Physician Discharge Summary  Patient ID: Luis Carr MRN: [id number] DOB/AGE: 1946/08/29 74 y.o.  Admit date: 07/06/2020 Discharge date: 07/07/2020  Referring:Hall, Kathleene Hazel, MD Primary Care:Hall, Kathleene Hazel, MD Primary Cardiologist:No primary care provider    Admission Diagnoses: Diaphragmatic hernia  Discharge Diagnoses:  Active Problems:   Diaphragmatic hernia  Past Medical History:  Diagnosis Date  . Head injury    age 81- hasd a laceration, concussion - maybe  . Headache   . Pneumonia 2021  History of Present Illness: Luis [id number] y.o.malepresents for surgical evaluation of a right diaphragmatic hernia. This was found incidentally. In August of this year he was treated in the emergency department for a presumed pneumonia and chest x-ray showed a right-sided opacity. He subsequently underwent a CT scan a month later and that showed right-sided diaphragmatic hernia with intestine herniating into his right thoracic space. From a symptomatic standpoint he denies any chest or back pain. He denies any abdominal pain no constipation. He also denies any nausea or vomiting. He has no recent or remote history of any blunt or penetrating trauma outside of a ground-level fall that he experienced in 1994. He states that he fell on his sacrum but required no surgical intervention for that.  From a respiratory standpoint he has had some increased sputum production this is persisted since his pneumonia.  The patient and his studies were reviewed by Dr. Cliffton Asters who recommended admission for robot-assisted laparoscopic surgery for repair of the hernia and he was admitted this hospitalization electively for the procedure    Discharged Condition: good  Hospital Course: Patient was admitted and was taken to the operating room where he underwent the below described procedure.  He tolerated it well and was taken to the postanesthesia care unit in stable  condition.  Postoperative hospital course:  The patient has done well.  He has maintained stable vitals and is afebrile.  He was tolerating diet.  His chest tube was removed in the PACU as it showed no evidence of pneumothorax.  Lab values are stable.  He does have a very minor expected acute blood loss anemia.  Renal function and electrolytes are stable.  Incisions are healing without evidence of infection.  He is tolerating routine advancement activities.  His pain is under adequate control with usual medications.  He is felt to be stable for discharge.  Consults: None  Significant Diagnostic Studies: routine labs and serial CXR's  Treatments: surgery:  PRE-OPERATIVE DIAGNOSIS:  DIAPHRAGMATIC HERNIA  POST-OPERATIVE DIAGNOSIS:  DIAPHRAGMATIC HERNIA  PROCEDURE:  Procedure(s): XI ROBOTIC ASSISTED REPAIR OF DIAPHRAGMATIC HERNIA (N/A)  SURGEON:  Surgeon(s) and Role:    * Lightfoot, Eliezer Lofts, MD - Primary  PHYSICIAN ASSISTANT: Courney Garrod PA-C  ANESTHESIA:   general  EBL:  20 mL    Discharge Exam: Blood pressure (!) 119/59, pulse 69, temperature 98.3 F (36.8 C), temperature source Oral, resp. rate 13, height 6\' 1"  (1.854 m), weight 83.6 kg, SpO2 94 %.  General appearance: alert, cooperative and no distress Heart: regular rate and rhythm Lungs: clear to auscultation bilaterally Abdomen: benign Extremities: no edema or calf tenderness Wound: incis healing well Disposition: Discharge disposition: 01-Home or Self Care       Discharge Instructions    Discharge patient   Complete by: As directed    Discharge disposition: 01-Home or Self Care   Discharge patient date: 07/07/2020     Allergies as of 07/07/2020   No Known Allergies     Medication List  TAKE these medications   Coenzyme Q10 300 MG Caps Take 300 mg by mouth daily.   finasteride 5 MG tablet Commonly known as: PROSCAR Take 5 mg by mouth daily.   Fish Oil Ultra 1400 MG Caps Take 1,400 mg by  mouth daily.   multivitamin with minerals Tabs tablet Take 1 tablet by mouth daily.   pravastatin 10 MG tablet Commonly known as: PRAVACHOL Take 10 mg by mouth every evening.   sildenafil 100 MG tablet Commonly known as: VIAGRA Take 100 mg by mouth daily as needed for erectile dysfunction.   tamsulosin 0.4 MG Caps capsule Commonly known as: FLOMAX Take 0.4 mg by mouth every evening.   traMADol 50 MG tablet Commonly known as: ULTRAM Take 1 tablet (50 mg total) by mouth every 6 (six) hours as needed for up to 7 days (mild pain).       Follow-up Information    Lightfoot, Eliezer Lofts, MD In 1 week.   Specialty: Cardiothoracic Surgery Why: See discharge paperwork for follow-up appointment with surgeon. Contact information: [address] [place] [address] [phone]               Signed: [name] PA-C 07/07/2020, 7:58 AM

## 2020-07-06 NOTE — H&P (Signed)
[address]       [name] [address]             [phone]       No events since his clinic appointment He does complain of some dysphagia, and increased secretions  OR today for robotic assisted diaphragmatic hernia repair.  Will try to only use absorbable mesh if possible.  AHMIR BRACKEN [facility] Medical Record #[id number] Date of Birth: 1946-07-29  Referring: Benita Stabile, MD Primary Care: Benita Stabile, MD Primary Cardiologist: No primary care provider on file.  Chief Complaint:        Chief Complaint  Patient presents with  . Diaphragmatic Hernia    Surgical consult, Chest CT 05/31/2020    History of Present Illness:    Luis Carr 74 y.o. male presents for surgical evaluation of a right diaphragmatic hernia.  This was found incidentally.  In August of this year he was treated in the emergency department for a presumed pneumonia and chest x-ray showed a right-sided opacity.  He subsequently underwent a CT scan a month later and that showed right-sided diaphragmatic hernia with intestine herniating into his right thoracic space.  From a symptomatic standpoint he denies any chest or back pain.  He denies any abdominal pain no constipation.  He also denies any nausea or vomiting.  He has no recent or remote history of any blunt or penetrating trauma outside of a ground-level fall that he experienced in 1994.  He states that he fell on his sacrum but required no surgical intervention for that.  From a respiratory standpoint he has had some increased sputum production this is persisted since his pneumonia.    No past medical history on file.   No family history on file.   Social History      Tobacco Use  Smoking Status Not on file    Social History      Substance and Sexual Activity  Alcohol Use Not on file     No Known Allergies        Current Outpatient Medications  Medication Sig Dispense Refill  .  amoxicillin-clavulanate (AUGMENTIN) 875-125 MG tablet Take 1 tablet by mouth 2 (two) times daily.    . Coenzyme Q-10 200 MG CAPS Take 200 mg by mouth.    . finasteride (PROSCAR) 5 MG tablet     . Omega-3 Fatty Acids (FISH OIL) 1000 MG CAPS Take 1,000 mg by mouth once.    . pravastatin (PRAVACHOL) 10 MG tablet Take 10 mg by mouth daily.    . sildenafil (VIAGRA) 100 MG tablet Take 100 mg by mouth daily as needed for erectile dysfunction.    . tamsulosin (FLOMAX) 0.4 MG CAPS capsule      No current facility-administered medications for this visit.    Review of Systems  Constitutional: Negative.   Respiratory: Positive for sputum production.   Cardiovascular: Negative.   Gastrointestinal: Negative.   Neurological: Negative.     PHYSICAL EXAMINATION: BP (!) 157/77 (BP Location: Right Arm, Patient Position: Sitting)   Pulse 77   Resp 18   Ht 6\' 1"  (1.854 m)   Wt 184 lb 9.6 oz (83.7 kg)   SpO2 94% Comment: RA with mask on  BMI 24.36 kg/m   Physical Exam Constitutional:      General: He is not in acute distress.    Appearance: Normal appearance. He is not ill-appearing, toxic-appearing or diaphoretic.  Eyes:  Extraocular Movements: Extraocular movements intact.     Conjunctiva/sclera: Conjunctivae normal.  Cardiovascular:     Rate and Rhythm: Normal rate.  Pulmonary:     Effort: Pulmonary effort is normal. No respiratory distress.  Musculoskeletal:        General: Normal range of motion.     Cervical back: Normal range of motion.  Neurological:     General: No focal deficit present.     Mental Status: He is alert and oriented to person, place, and time.      Diagnostic Studies & Laboratory data:     Recent Radiology Findings:   CT CHEST WO CONTRAST  Result Date: 05/31/2020 CLINICAL DATA:  Abnormal finding on chest x-ray right lung base. EXAM: CT CHEST WITHOUT CONTRAST TECHNIQUE: Multidetector CT imaging of the chest was performed following the  standard protocol without IV contrast. COMPARISON:  Chest x-ray 05/07/2020 and chest CT 07/16/2003 FINDINGS: Cardiovascular: Heart is normal size. Calcified plaque is present over the left anterior descending and right coronary arteries. Thoracic aorta is normal in caliber. There is mild calcified plaque over the descending thoracic aorta. Remaining vascular structures are unremarkable. Mediastinum/Nodes: No significant hilar or mediastinal adenopathy. Remaining mediastinal structures are normal. Lungs/Pleura: Left lung is normally inflated. Mild volume loss of the right lung due to anterior right diaphragmatic hernia containing loop of colon. This accounts for the chest radiograph abnormality. No underlying pulmonary disease in the right lung base. No airspace process or effusion. Airways are normal. Upper Abdomen: Calcified plaque over the abdominal aorta. No acute findings. Musculoskeletal: Degenerative change of the spine. IMPRESSION: 1. No acute cardiopulmonary disease. 2. Anterior right diaphragmatic hernia containing loop of colon with associated mild volume loss of the right lung. This accounts for the chest radiograph abnormality. 3. Aortic atherosclerosis. Atherosclerotic coronary artery disease. Aortic Atherosclerosis (ICD10-I70.0). Electronically Signed   By: Elberta Fortis M.D.   On: 05/31/2020 10:45       I have independently reviewed the above radiology studies  and reviewed the findings with the patient.   Recent Lab Findings:      Lab Results  Component Value Date   WBC 5.7 04/12/2020   HGB 14.4 04/12/2020   HCT 42.5 04/12/2020   PLT 306 04/12/2020   GLUCOSE 125 (H) 04/12/2020   NA 139 04/12/2020   K 4.0 04/12/2020   CL 104 04/12/2020   CREATININE 0.78 04/12/2020   BUN 17 04/12/2020   CO2 26 04/12/2020       Problem List: Diaphragmatic hernia Incarcerated bowel in the right chest.  Assessment / Plan:   This is a 74 year old male that presents with  a diaphragmatic hernia along with incarcerated bowel into the right chest.  I personally reviewed his scan from September 2021 as well as a CT scan from 2004.  On the 2004 scan there appeared to be a right-sided opacity consistent with omentum that was herniated through a potential diaphragmatic defect.  On the most recent scan of this year the hernia is much bigger with a good component of bowel herniating into his right chest.  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  He currently is asymptomatic but given the fact that this hernia is enlarging over the last 17 years I have recommended that he undergo a robotic assisted laparoscopy with the diaphragmatic hernia repair.  We discussed the potential of requiring mesh for the closure.  He would like some time to discuss surgical options with his wife.  He will call us  back to let us know his final decision.

## 2020-07-06 NOTE — Anesthesia Postprocedure Evaluation (Signed)
Anesthesia Post Note  Patient: Luis Carr  Procedure(s) Performed: XI ROBOTIC ASSISTED REPAIR OF DIAPHRAGMATIC HERNIA (N/A )     Patient location during evaluation: PACU Anesthesia Type: General Level of consciousness: awake and alert Pain management: pain level controlled Vital Signs Assessment: post-procedure vital signs reviewed and stable Respiratory status: spontaneous breathing, nonlabored ventilation, respiratory function stable and patient connected to nasal cannula oxygen Cardiovascular status: blood pressure returned to baseline and stable Postop Assessment: no apparent nausea or vomiting Anesthetic complications: no   No complications documented.  Last Vitals:  Vitals:   07/06/20 1627 07/06/20 1640  BP: 137/69 (!) 144/64  Pulse: 87 83  Resp: 15 20  Temp:    SpO2: 96% 95%    Last Pain:  Vitals:   07/06/20 1510  PainSc: 0-No pain                 Kennieth Rad

## 2020-07-06 NOTE — Op Note (Signed)
[address]       [name] [address]             [phone]        07/06/2020  Patient:  Luis Carr Pre-Op Dx: Diaphragmatic Hernia   Post-op Dx: Mardene Sayer diaphragmatic hernia Procedure: - Robotic assisted laparoscopy - Morgagni diaphragmatic hernia repair with 10X7 Gentrix mesh -14 French pigtail catheter placement into the right chest.  Surgeon and Role:      * Brylie Sneath, Eliezer Lofts, MD - Primary    Webb Laws, PA-C- assisting  Anesthesia  general EBL: 50 ml Blood Administration: None Specimen: Hernia sac   Counts: correct   Indications: This is a 74 year old male that presents with a diaphragmatic hernia along with incarcerated bowel into the right chest. I personally reviewed his scan from September 2021 as well as a CT scan from 2004. On the 2004 scan there appeared to be a right-sided opacity consistent with omentum that was herniated through a potential diaphragmatic defect. On the most recent scan of this year the hernia is much bigger with a good component of bowel herniating into his right chest. 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  There was colon and small bowel that was incarcerated within the hernia itself.  And primary closure of the defect was achieved with 3 pexied stitches to the anterior abdominal wall.  This was then oversewn with a V-Loc suture, and covered with a Gentrix mesh.  Operative Technique: After the risks, benefits and alternatives were thoroughly discussed, the patient was brought to the operative theatre.  Anesthesia was induced, the patient was then prepped and draped  in normal sterile fashion.  An appropriate surgical pause was performed, and pre-operative antibiotics were dosed accordingly.  We began with a 1 cm incision 15 cm caudad from the xiphoid and slightly lateral to the umbilicus.  Using an Optiview we entered the peritoneal space.  The abdomen was then insufflated with CO2.  3 other robotic ports were placed to triangulate the hiatus.  Another 12 mm port was placed in place at the level of the umbilicus laterally for an assistant port and another 26mm trocar was placed in the right lower quadrant for insufflation. The patient was then placed in steep reverse Trendelenburg, and then the robot was docked.  We began by reducing the hernia content into the abdomen.  There was colon and small bowel along with the falciform ligament that was within the hernia sac.  The falciform ligament was divided to fully expose the inferior rim of the hernia.  We get then completely excised the hernia sac all the way into the anterior mediastinum.  This was an avascular plane.  It was densely adherent to the right pleura.  A small rent in the right lower was encountered and this was opened up widely to prevent any tension pneumothoraces.  We then continued with our dissection and placed in the hernia sac in an Endo Catch bag and removed it from the lateral 12 mm trocar.    We then focused our attention on closure of the hernia defect.  A 7 x 10 Gentrix mesh was prepared.  Two 1 x 7 cm strips were created and this was used to buttress the inferior edge of the hernia defect.  Next 3 tacking sutures were placed in the lower edge of the hernia defect and use stitch fashion and passed the valve through the anterior abdominal wall to reapproximate the lower edge of the hernia defect to the anterior abdominal wall.  This area was then oversewn with a 2-0 V lock suture to completely close the hernia defect.  The remainder of the mesh was then tacked as an overlay onto the anterior  abdominal wall and the diaphragm.  All ports were removed under direct visualization.  The skin and soft tissue were closed with absorbable suture.  We then placed a 14 French pigtail catheter into the right chest and connected to Heimlich valve.  The patient tolerated the procedure without any immediate complications, and was transferred to the PACU in stable condition.  Carston Riedl Keane Scrape

## 2020-07-06 NOTE — Brief Op Note (Signed)
07/06/2020  12:30 PM  PATIENT:  Luis Carr  74 y.o. male  PRE-OPERATIVE DIAGNOSIS:  DIAPHRAGMATIC HERNIA  POST-OPERATIVE DIAGNOSIS:  DIAPHRAGMATIC HERNIA  PROCEDURE:  Procedure(s): XI ROBOTIC ASSISTED REPAIR OF DIAPHRAGMATIC HERNIA (N/A)  SURGEON:  Surgeon(s) and Role:    * Lightfoot, Eliezer Lofts, MD - Primary  PHYSICIAN ASSISTANT: Hoover Grewe PA-C  ANESTHESIA:   general  EBL:  20 mL   BLOOD ADMINISTERED:none  DRAINS: 1 PIGTAIL CATHETER RIGHT CHEST   LOCAL MEDICATIONS USED:  OTHER EXPAREL  SPECIMEN:  Source of Specimen:  HERNIA SAC  DISPOSITION OF SPECIMEN:  PATHOLOGY  COUNTS:  YES  TOURNIQUET:  * No tourniquets in log *  DICTATION: .Dragon Dictation  PLAN OF CARE ADMIT TO OBSERVATION STATUS  PATIENT DISPOSITION:  PACU - hemodynamically stable.   Delay start of Pharmacological VTE agent (>24hrs) due to surgical blood loss or risk of bleeding: yes  COMPLICATIONS: NO KNOWN

## 2020-07-06 NOTE — Anesthesia Procedure Notes (Signed)
Procedure Name: Intubation Date/Time: 07/06/2020 7:52 AM Performed by: Adria Dill, CRNA Pre-anesthesia Checklist: Patient identified, Emergency Drugs available, Suction available and Patient being monitored Patient Re-evaluated:Patient Re-evaluated prior to induction Oxygen Delivery Method: Circle system utilized Preoxygenation: Pre-oxygenation with 100% oxygen Induction Type: IV induction Ventilation: Mask ventilation without difficulty Laryngoscope Size: Miller and 3 Grade View: Grade I Tube type: Oral Tube size: 7.5 mm Number of attempts: 1 Airway Equipment and Method: Stylet and Oral airway Placement Confirmation: ETT inserted through vocal cords under direct vision,  positive ETCO2 and breath sounds checked- equal and bilateral Secured at: 21 cm Tube secured with: Tape Dental Injury: Teeth and Oropharynx as per pre-operative assessment

## 2020-07-06 NOTE — Anesthesia Procedure Notes (Signed)
Arterial Line Insertion Start/End10/25/2021 7:25 AM, 07/06/2020 7:26 AM Performed by: CRNA  Patient location: Pre-op. Preanesthetic checklist: patient identified, IV checked, site marked, risks and benefits discussed, surgical consent, monitors and equipment checked, pre-op evaluation, timeout performed and anesthesia consent Lidocaine 1% used for infiltration Left, radial was placed Catheter size: 20 G Hand hygiene performed , maximum sterile barriers used  and Seldinger technique used Allen's test indicative of satisfactory collateral circulation Attempts: 1 Procedure performed without using ultrasound guided technique. Following insertion, dressing applied and Biopatch. Post procedure assessment: normal  Patient tolerated the procedure well with no immediate complications.

## 2020-07-06 NOTE — Discharge Instructions (Signed)
Discharge Instructions:  1. You may shower, please wash incisions daily with soap and water and keep dry.  If you wish to cover wounds with dressing you may do so but please keep clean and change daily.  No tub baths or swimming until incisions have completely healed.  If your incisions become red or develop any drainage please call our office at [phone]  2. No Driving until cleared by surgeon office and you are no longer using narcotic pain medications  3. Fever of 101.5 for at least 24 hours with no source, please contact our office at [phone]  4. Activity- up as tolerated, please walk at least 3 times per day.  Avoid strenuous activity, lifting over 10 lbs for 2 weeks  5. If any questions or concerns arise, please do not hesitate to contact our office at [phone]

## 2020-07-06 NOTE — Transfer of Care (Signed)
Immediate Anesthesia Transfer of Care Note  Patient: Luis Carr  Procedure(s) Performed: XI ROBOTIC ASSISTED REPAIR OF DIAPHRAGMATIC HERNIA (N/A )  Patient Location: PACU  Anesthesia Type:General  Level of Consciousness: awake and patient cooperative  Airway & Oxygen Therapy: Patient Spontanous Breathing and Patient connected to nasal cannula oxygen  Post-op Assessment: Report given to RN and Post -op Vital signs reviewed and stable  Post vital signs: Reviewed and stable  Last Vitals:  Vitals Value Taken Time  BP 129/61 07/06/20 1240  Temp 36.9 C 07/06/20 1240  Pulse 93 07/06/20 1241  Resp 16 07/06/20 1241  SpO2 96 % 07/06/20 1241  Vitals shown include unvalidated device data.  Last Pain:  Vitals:   07/06/20 0643  PainSc: 0-No pain         Complications: No complications documented.

## 2020-07-06 NOTE — Progress Notes (Signed)
Dr. Cliffton Asters at bedside. MD pulled chest tube, held pressure and placed Tegaderm over insertion side. Site with slight red drainage. Will continue to monitor.

## 2020-07-07 ENCOUNTER — Encounter (HOSPITAL_COMMUNITY): Payer: Self-pay | Admitting: Thoracic Surgery (Cardiothoracic Vascular Surgery)

## 2020-07-07 DIAGNOSIS — K449 Diaphragmatic hernia without obstruction or gangrene: Secondary | ICD-10-CM | POA: Diagnosis not present

## 2020-07-07 LAB — BASIC METABOLIC PANEL
Anion gap: 10 (ref 5–15)
BUN: 15 mg/dL (ref 8–23)
CO2: 21 mmol/L — ABNORMAL LOW (ref 22–32)
Calcium: 8.4 mg/dL — ABNORMAL LOW (ref 8.9–10.3)
Chloride: 106 mmol/L (ref 98–111)
Creatinine, Ser: 0.9 mg/dL (ref 0.61–1.24)
GFR, Estimated: >60 mL/min (ref >60)
Glucose, Bld: 118 mg/dL — ABNORMAL HIGH (ref 70–99)
Potassium: 4 mmol/L (ref 3.5–5.1)
Sodium: 137 mmol/L (ref 135–145)

## 2020-07-07 LAB — SURGICAL PATHOLOGY

## 2020-07-07 MED ORDER — TRAMADOL HCL 50 MG PO TABS: 50.0000 mg | ORAL_TABLET | Freq: Four times a day (QID) | ORAL | 0 refills | Status: AC | PRN

## 2020-07-07 NOTE — Plan of Care (Signed)
  Problem: Education: Goal: Required Educational Video(s) Outcome: Adequate for Discharge   Problem: Clinical Measurements: Goal: Ability to maintain clinical measurements within normal limits will improve Outcome: Adequate for Discharge Goal: Postoperative complications will be avoided or minimized Outcome: Adequate for Discharge   Problem: Skin Integrity: Goal: Demonstration of wound healing without infection will improve Outcome: Adequate for Discharge   Problem: Education: Goal: Knowledge of General Education information will improve Description: Including pain rating scale, medication(s)/side effects and non-pharmacologic comfort measures Outcome: Adequate for Discharge   Problem: Health Behavior/Discharge Planning: Goal: Ability to manage health-related needs will improve Outcome: Adequate for Discharge   Problem: Clinical Measurements: Goal: Ability to maintain clinical measurements within normal limits will improve Outcome: Adequate for Discharge Goal: Will remain free from infection Outcome: Adequate for Discharge Goal: Respiratory complications will improve Outcome: Adequate for Discharge Goal: Cardiovascular complication will be avoided Outcome: Adequate for Discharge   Problem: Activity: Goal: Risk for activity intolerance will decrease Outcome: Adequate for Discharge   Problem: Nutrition: Goal: Adequate nutrition will be maintained Outcome: Adequate for Discharge   Problem: Coping: Goal: Level of anxiety will decrease Outcome: Adequate for Discharge   Problem: Elimination: Goal: Will not experience complications related to bowel motility Outcome: Adequate for Discharge Goal: Will not experience complications related to urinary retention Outcome: Adequate for Discharge   Problem: Pain Managment: Goal: General experience of comfort will improve Outcome: Adequate for Discharge   Problem: Safety: Goal: Ability to remain free from injury will  improve Outcome: Adequate for Discharge   Problem: Skin Integrity: Goal: Risk for impaired skin integrity will decrease Outcome: Adequate for Discharge

## 2020-07-07 NOTE — Progress Notes (Signed)
Pt d/c home. Discharge information given to pt and wife. All questions answered.

## 2020-07-07 NOTE — Progress Notes (Addendum)
      [address]       [place] [address]             [phone]      1 Day Post-Op Procedure(s) (LRB): XI ROBOTIC ASSISTED REPAIR OF DIAPHRAGMATIC HERNIA (N/A) Subjective: Feels pretty well, minor pain overall. Tolerating diet fairly well  Objective: Vital signs in last 24 hours: Temp:  [98.2 F (36.8 C)-98.7 F (37.1 C)] 98.3 F (36.8 C) (10/26 0739) Pulse Rate:  [65-94] 69 (10/26 0739) Cardiac Rhythm: Normal sinus rhythm (10/25 1944) Resp:  [13-24] 13 (10/26 0739) BP: (115-144)/(52-73) 119/59 (10/26 0739) SpO2:  [93 %-97 %] 94 % (10/26 0739) Arterial Line BP: (136-183)/(54-76) 175/72 (10/25 1410)  Hemodynamic parameters for last 24 hours:    Intake/Output from previous day: 10/25 0701 - 10/26 0700 In: 3460 [P.O.:620; I.V.:2240; IV Piggyback:600] Out: 2625 [Urine:2605; Blood:20] Intake/Output this shift: Total I/O In: 240 [P.O.:240] Out: -   General appearance: alert, cooperative and no distress Heart: regular rate and rhythm Lungs: clear to auscultation bilaterally Abdomen: benign Extremities: no edema or calf tenderness Wound: incis healing well  Lab Results: Recent Labs    07/06/20 1809  WBC 8.9  HGB 12.7*  HCT 37.7*  PLT 246   BMET:  Recent Labs    07/07/20 0038  NA 137  K 4.0  CL 106  CO2 21*  GLUCOSE 118*  BUN 15  CREATININE 0.90  CALCIUM 8.4*    PT/INR: No results for input(s): LABPROT, INR in the last 72 hours. ABG    Component Value Date/Time   PHART 7.427 07/02/2020 1110   HCO3 22.7 07/02/2020 1110   ACIDBASEDEF 1.1 07/02/2020 1110   O2SAT 97.4 07/02/2020 1110   CBG (last 3)  No results for input(s): GLUCAP in the last 72 hours.  Meds Scheduled Meds: . acetaminophen  1,000 mg Oral Q6H  . docusate sodium  100 mg Oral BID  . enoxaparin (LOVENOX) injection  40 mg Subcutaneous Q24H  . finasteride  5 mg Oral Daily  . ketorolac  15 mg Intravenous Q6H  . pravastatin  10 mg Oral QPM  . tamsulosin  0.4 mg  Oral QPM   Continuous Infusions: PRN Meds:.magnesium hydroxide, ondansetron **OR** ondansetron (ZOFRAN) IV, traMADol  Xrays DG Chest 1 View  Result Date: 07/06/2020 CLINICAL DATA:  Post diaphragmatic hernia repair EXAM: CHEST  1 VIEW COMPARISON:  Portable exam 1310 hours compared to 07/02/2020 FINDINGS: Pigtail RIGHT thoracostomy tube noted. Normal heart size, mediastinal contours, and pulmonary vascularity. Minimal subsegmental atelectasis RIGHT base. Minimal RIGHT pneumothorax versus skin fold laterally at inferior RIGHT hemithorax. Remaining lungs clear without infiltrate or pleural effusion. Multiple old LEFT rib fractures. IMPRESSION: Pigtail RIGHT thoracostomy tube with minimal pneumothorax versus skin fold laterally at inferior RIGHT hemithorax. Electronically Signed   By: Ulyses Southward M.D.   On: 07/06/2020 13:45    Assessment/Plan: S/P Procedure(s) (LRB): XI ROBOTIC ASSISTED REPAIR OF DIAPHRAGMATIC HERNIA (N/A)  POD#1 1 afeb, VSS some syst HTN but mostly well controlled 2 sats good on RA 3 Labs stable, minor ABLA, renal fxn is normal 4 doing well, stabe for d/c    LOS: 0 days    [name] PA-C Pager [phone] 07/07/2020  Agree with above. Doing well. Home today  [name]

## 2020-07-17 ENCOUNTER — Encounter: Payer: Self-pay | Admitting: Thoracic Surgery (Cardiothoracic Vascular Surgery)

## 2020-07-17 ENCOUNTER — Other Ambulatory Visit: Payer: Self-pay

## 2020-07-17 ENCOUNTER — Ambulatory Visit (INDEPENDENT_AMBULATORY_CARE_PROVIDER_SITE_OTHER): Payer: Self-pay | Admitting: Thoracic Surgery (Cardiothoracic Vascular Surgery)

## 2020-07-17 VITALS — BP 132/72 | HR 102 | Resp 18 | Ht 73.0 in | Wt 178.0 lb

## 2020-07-17 DIAGNOSIS — K449 Diaphragmatic hernia without obstruction or gangrene: Secondary | ICD-10-CM

## 2020-07-17 DIAGNOSIS — Z9889 Other specified postprocedural states: Secondary | ICD-10-CM

## 2020-07-17 NOTE — Progress Notes (Signed)
      [address]       [place] [address]             [phone]        KEEGEN HEFFERN [facility] Medical Record #[id number] Date of Birth: 05-16-1946  Referring: Benita Stabile, MD Primary Care: Benita Stabile, MD Primary Cardiologist:No primary care provider on file.  Reason for visit:   follow-up  History of Present Illness:     Mr. Luis Carr comes in for his first follow-up appointment.  Overall he is doing well.  He complains of some anorexia, and incisional pain.  He is moving around without much difficulty and walking about half a mile a day.  Physical Exam: BP 132/72 (BP Location: Right Arm, Patient Position: Sitting)   Pulse (!) 102   Resp 18   Ht 6\' 1"  (1.854 m)   Wt 178 lb (80.7 kg)   SpO2 97% Comment: RA with mask on  BMI 23.48 kg/m   Alert NAD Incision clean.   Abdomen soft, ND No peripheral edema   Diagnostic Studies & Laboratory data:  Path: A. HERNIA SAC, DIAPHRAGM:  - Fibroadipose tissue consistent with hernia sac     Assessment / Plan:   74 year old male status post Morgagni  diaphragmatic hernia repair.  Overall doing well. We will follow up in 1 month with a chest x-ray.   73 07/17/2020 2:13 PM

## 2020-08-05 DIAGNOSIS — N401 Enlarged prostate with lower urinary tract symptoms: Secondary | ICD-10-CM | POA: Diagnosis not present

## 2020-08-05 DIAGNOSIS — J690 Pneumonitis due to inhalation of food and vomit: Secondary | ICD-10-CM | POA: Diagnosis not present

## 2020-08-05 DIAGNOSIS — R438 Other disturbances of smell and taste: Secondary | ICD-10-CM | POA: Diagnosis not present

## 2020-08-05 DIAGNOSIS — Z0001 Encounter for general adult medical examination with abnormal findings: Secondary | ICD-10-CM | POA: Diagnosis not present

## 2020-08-05 DIAGNOSIS — Z85828 Personal history of other malignant neoplasm of skin: Secondary | ICD-10-CM | POA: Diagnosis not present

## 2020-08-05 DIAGNOSIS — N529 Male erectile dysfunction, unspecified: Secondary | ICD-10-CM | POA: Diagnosis not present

## 2020-08-05 DIAGNOSIS — H6123 Impacted cerumen, bilateral: Secondary | ICD-10-CM | POA: Diagnosis not present

## 2020-08-05 DIAGNOSIS — R030 Elevated blood-pressure reading, without diagnosis of hypertension: Secondary | ICD-10-CM | POA: Diagnosis not present

## 2020-08-05 DIAGNOSIS — Z0000 Encounter for general adult medical examination without abnormal findings: Secondary | ICD-10-CM | POA: Diagnosis not present

## 2020-08-11 DIAGNOSIS — Z6825 Body mass index (BMI) 25.0-25.9, adult: Secondary | ICD-10-CM | POA: Diagnosis not present

## 2020-08-11 DIAGNOSIS — N401 Enlarged prostate with lower urinary tract symptoms: Secondary | ICD-10-CM | POA: Diagnosis not present

## 2020-08-11 DIAGNOSIS — R030 Elevated blood-pressure reading, without diagnosis of hypertension: Secondary | ICD-10-CM | POA: Diagnosis not present

## 2020-08-11 DIAGNOSIS — R7301 Impaired fasting glucose: Secondary | ICD-10-CM | POA: Diagnosis not present

## 2020-08-11 DIAGNOSIS — Z9889 Other specified postprocedural states: Secondary | ICD-10-CM | POA: Diagnosis not present

## 2020-08-11 DIAGNOSIS — Z85828 Personal history of other malignant neoplasm of skin: Secondary | ICD-10-CM | POA: Diagnosis not present

## 2020-08-11 DIAGNOSIS — E782 Mixed hyperlipidemia: Secondary | ICD-10-CM | POA: Diagnosis not present

## 2020-08-11 DIAGNOSIS — N529 Male erectile dysfunction, unspecified: Secondary | ICD-10-CM | POA: Diagnosis not present

## 2020-08-13 ENCOUNTER — Other Ambulatory Visit: Payer: Self-pay | Admitting: Thoracic Surgery (Cardiothoracic Vascular Surgery)

## 2020-08-13 DIAGNOSIS — K449 Diaphragmatic hernia without obstruction or gangrene: Secondary | ICD-10-CM

## 2020-08-14 ENCOUNTER — Encounter: Payer: Self-pay | Admitting: Thoracic Surgery (Cardiothoracic Vascular Surgery)

## 2020-08-14 ENCOUNTER — Other Ambulatory Visit: Payer: Self-pay

## 2020-08-14 ENCOUNTER — Ambulatory Visit
Admission: RE | Admit: 2020-08-14 | Discharge: 2020-08-14 | Disposition: A | Discharge status: Home / Self Care | Payer: Medicare PPO | Source: Ambulatory Visit | Attending: Thoracic Surgery (Cardiothoracic Vascular Surgery) | Admitting: Thoracic Surgery (Cardiothoracic Vascular Surgery)

## 2020-08-14 ENCOUNTER — Ambulatory Visit (INDEPENDENT_AMBULATORY_CARE_PROVIDER_SITE_OTHER): Payer: Self-pay | Admitting: Thoracic Surgery (Cardiothoracic Vascular Surgery)

## 2020-08-14 VITALS — BP 129/83 | HR 88 | Resp 20 | Ht 73.0 in | Wt 182.2 lb

## 2020-08-14 DIAGNOSIS — K449 Diaphragmatic hernia without obstruction or gangrene: Secondary | ICD-10-CM | POA: Diagnosis not present

## 2020-08-14 DIAGNOSIS — Z9889 Other specified postprocedural states: Secondary | ICD-10-CM

## 2020-08-14 DIAGNOSIS — S2242XA Multiple fractures of ribs, left side, initial encounter for closed fracture: Secondary | ICD-10-CM | POA: Diagnosis not present

## 2020-08-14 NOTE — Progress Notes (Signed)
° °   °  [address]       [place] [address]             [phone]        Luis Carr [facility] Medical Record #[id number] Date of Birth: 05-27-1946  Referring: Benita Stabile, MD Primary Care: Benita Stabile, MD Primary Cardiologist:No primary care provider on file.  Reason for visit:   follow-up  History of Present Illness:     Luis Carr comes in for his 1 month appointment.  Overall he is doing much better.  He occasionally has some pain with movement.  His respiratory status is improved.  He also states that his bowel movements have also become much easier.  Physical Exam: BP 129/83 (BP Location: Left Arm, Patient Position: Sitting)    Pulse 88    Resp 20    Ht 6\' 1"  (1.854 m)    Wt 182 lb 3.2 oz (82.6 kg)    SpO2 97% Comment: RA   BMI 24.04 kg/m   Alert NAD Incision clean. Abdomen soft, ND No peripheral edema   Diagnostic Studies & Laboratory data: CXR: Clear     Assessment / Plan:   74 year old male status post diaphragmatic hernia repair. Doing well Follow-up as needed.   73 08/14/2020 1:02 PM

## 2021-02-04 DIAGNOSIS — E782 Mixed hyperlipidemia: Secondary | ICD-10-CM | POA: Diagnosis not present

## 2021-02-04 DIAGNOSIS — R7301 Impaired fasting glucose: Secondary | ICD-10-CM | POA: Diagnosis not present

## 2021-02-09 DIAGNOSIS — I2510 Atherosclerotic heart disease of native coronary artery without angina pectoris: Secondary | ICD-10-CM | POA: Diagnosis not present

## 2021-02-09 DIAGNOSIS — R7301 Impaired fasting glucose: Secondary | ICD-10-CM | POA: Diagnosis not present

## 2021-02-09 DIAGNOSIS — I700 Atherosclerosis of aorta: Secondary | ICD-10-CM | POA: Diagnosis not present

## 2021-02-09 DIAGNOSIS — N401 Enlarged prostate with lower urinary tract symptoms: Secondary | ICD-10-CM | POA: Diagnosis not present

## 2021-02-09 DIAGNOSIS — E782 Mixed hyperlipidemia: Secondary | ICD-10-CM | POA: Diagnosis not present

## 2021-02-09 DIAGNOSIS — F5221 Male erectile disorder: Secondary | ICD-10-CM | POA: Diagnosis not present

## 2021-02-09 DIAGNOSIS — K449 Diaphragmatic hernia without obstruction or gangrene: Secondary | ICD-10-CM | POA: Diagnosis not present

## 2021-08-16 DIAGNOSIS — N401 Enlarged prostate with lower urinary tract symptoms: Secondary | ICD-10-CM | POA: Diagnosis not present

## 2021-08-16 DIAGNOSIS — E782 Mixed hyperlipidemia: Secondary | ICD-10-CM | POA: Diagnosis not present

## 2021-08-16 DIAGNOSIS — R7301 Impaired fasting glucose: Secondary | ICD-10-CM | POA: Diagnosis not present

## 2021-08-19 DIAGNOSIS — F5221 Male erectile disorder: Secondary | ICD-10-CM | POA: Diagnosis not present

## 2021-08-19 DIAGNOSIS — Z1211 Encounter for screening for malignant neoplasm of colon: Secondary | ICD-10-CM | POA: Diagnosis not present

## 2021-08-19 DIAGNOSIS — K449 Diaphragmatic hernia without obstruction or gangrene: Secondary | ICD-10-CM | POA: Diagnosis not present

## 2021-08-19 DIAGNOSIS — N401 Enlarged prostate with lower urinary tract symptoms: Secondary | ICD-10-CM | POA: Diagnosis not present

## 2021-08-19 DIAGNOSIS — I2510 Atherosclerotic heart disease of native coronary artery without angina pectoris: Secondary | ICD-10-CM | POA: Diagnosis not present

## 2021-08-19 DIAGNOSIS — I700 Atherosclerosis of aorta: Secondary | ICD-10-CM | POA: Diagnosis not present

## 2021-08-19 DIAGNOSIS — Z0001 Encounter for general adult medical examination with abnormal findings: Secondary | ICD-10-CM | POA: Diagnosis not present

## 2021-08-19 DIAGNOSIS — E782 Mixed hyperlipidemia: Secondary | ICD-10-CM | POA: Diagnosis not present

## 2021-08-19 DIAGNOSIS — R7301 Impaired fasting glucose: Secondary | ICD-10-CM | POA: Diagnosis not present

## 2021-10-19 DIAGNOSIS — Z1211 Encounter for screening for malignant neoplasm of colon: Secondary | ICD-10-CM | POA: Diagnosis not present

## 2021-12-01 IMAGING — CR DG CHEST 2V
2 series · 2 of 2 positions shown · non-contrast
Comparison: 04/12/2020

CLINICAL DATA: History of pneumonia, persistent cough, initial
encounter

EXAM:
CHEST - 2 VIEW

[w chest pa]
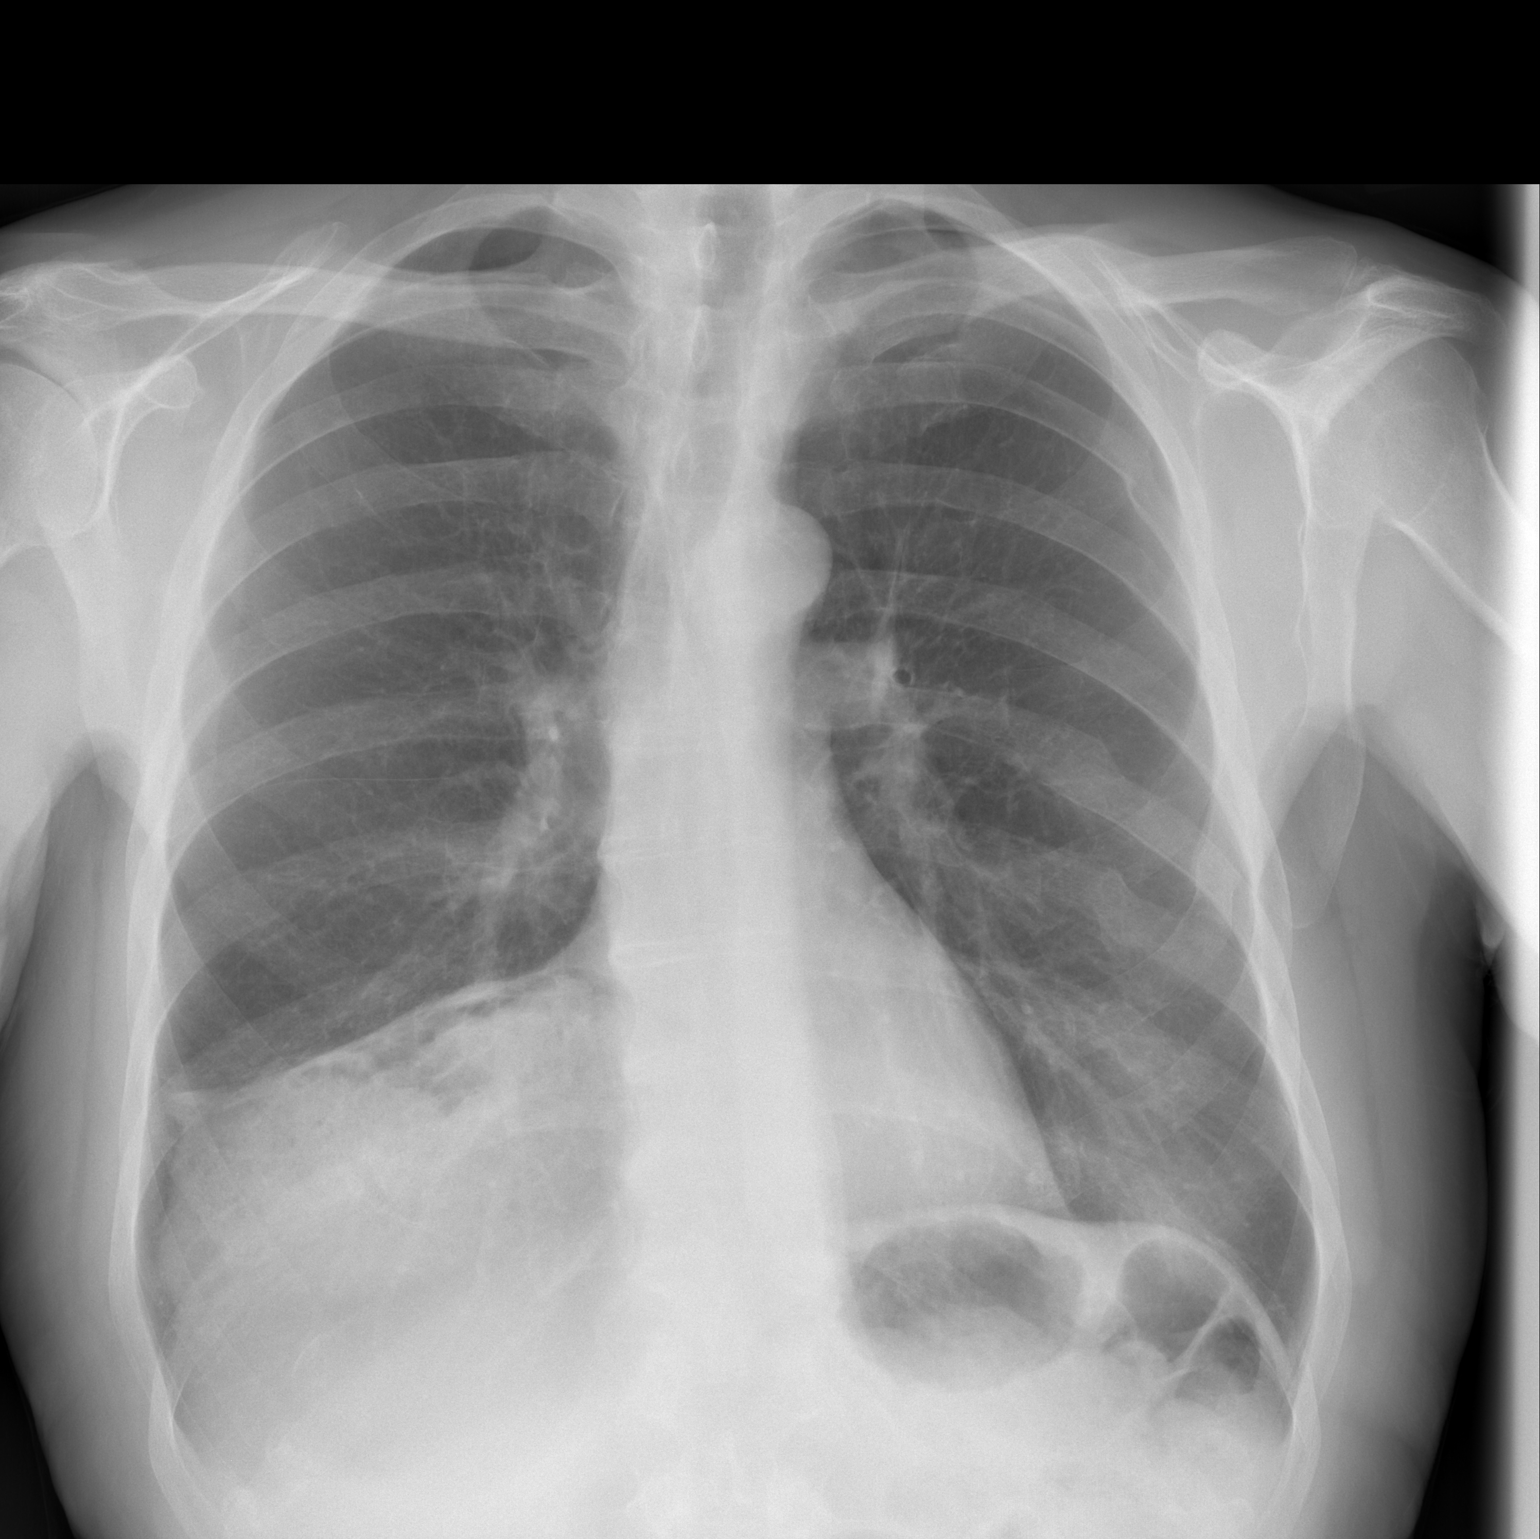

[w chest lat]
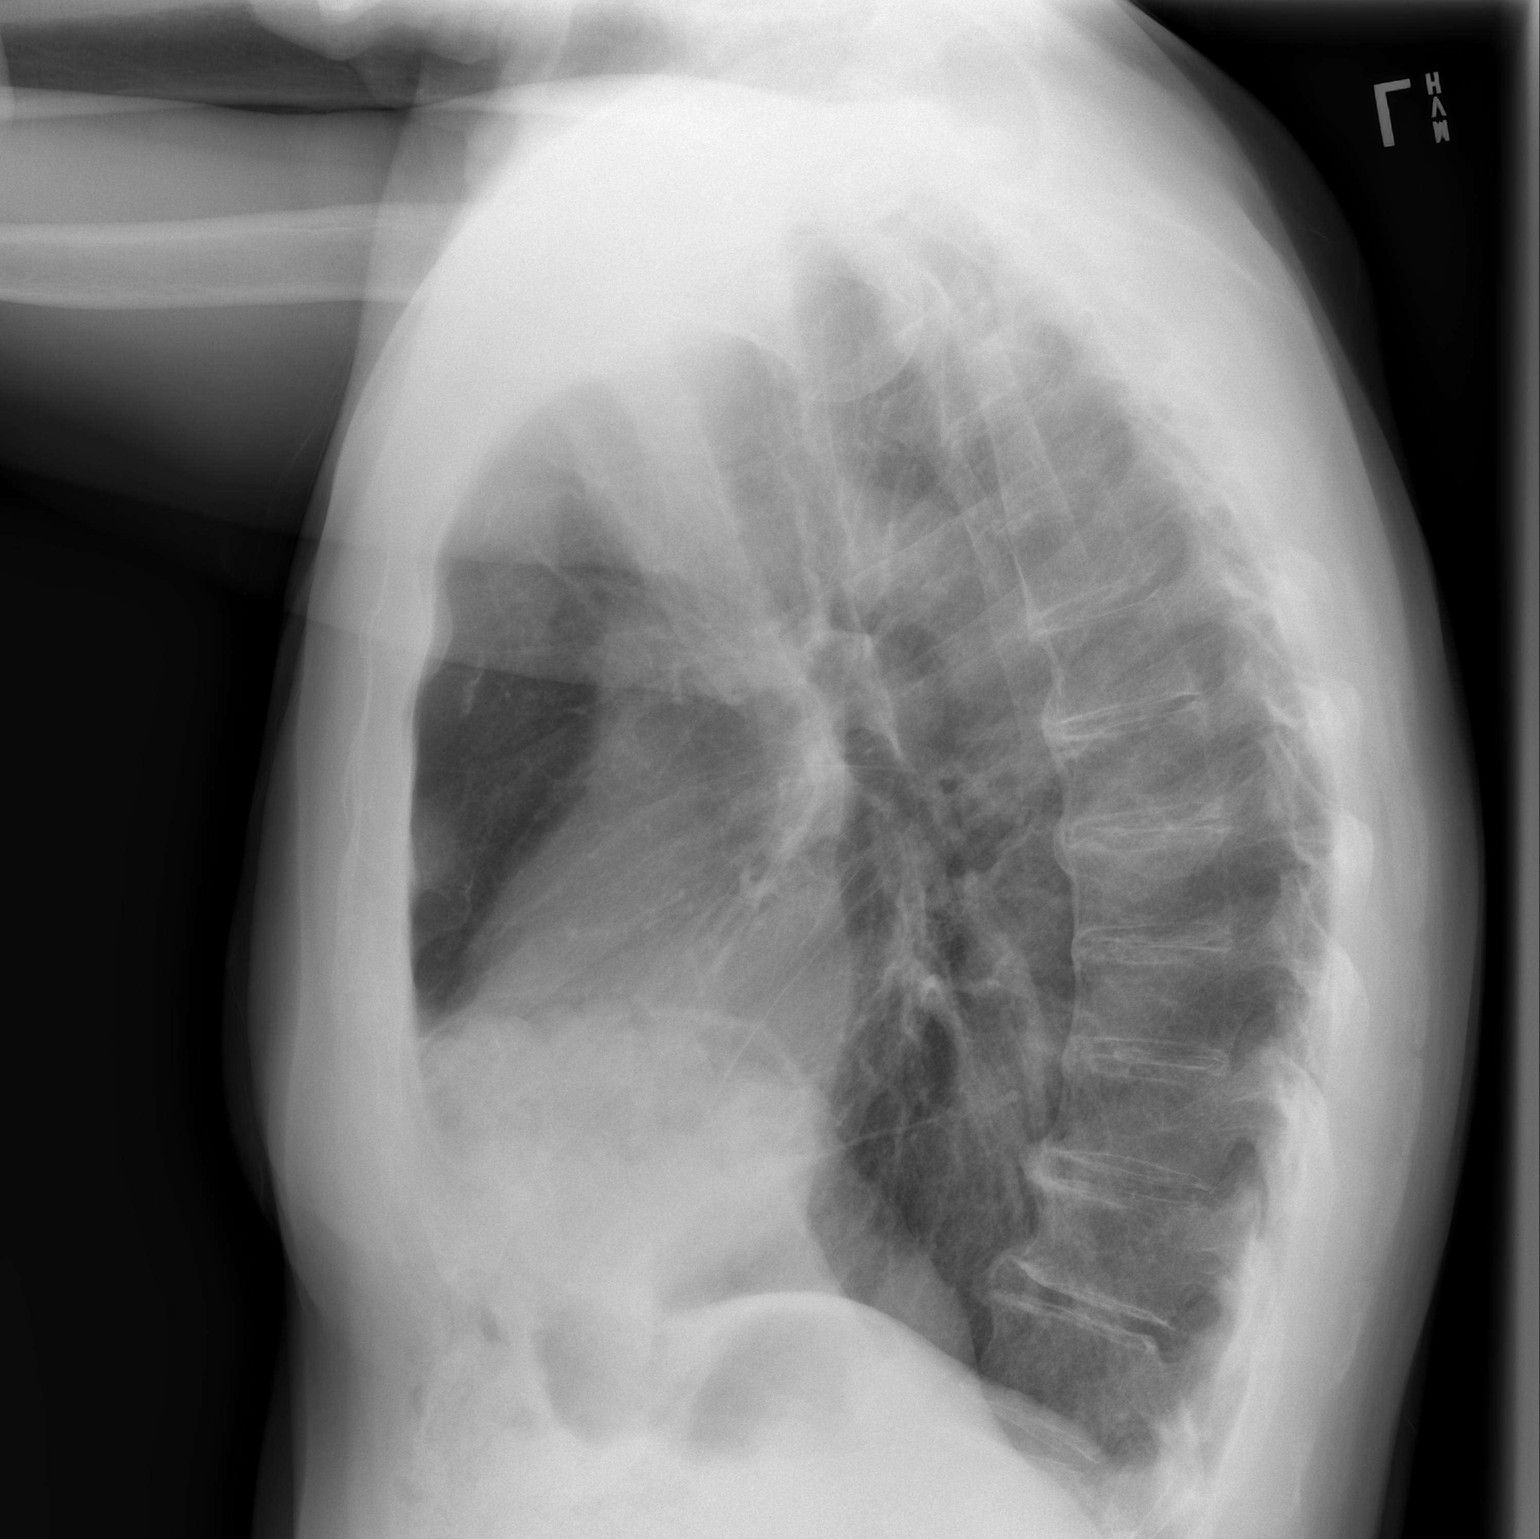

[2 of 2 positions shown; findings below may reference images not displayed]

FINDINGS: Cardiac shadow is within normal limits. Persistent rounded density
is noted over the right base. Comparison with prior CT examination
from 8669 shows a large cardiophrenic fat pad in this region. The
overall appearance is similar to that seen on the recent chest x-ray
without new focal infiltrate. CT of the chest is recommended for
further evaluation.
IMPRESSION: Stable right basilar density similar to that seen on the prior exam
as well as previous chest x-rays from 8669 although more prominent
on the current study. No definitive infiltrate is seen. CT of the
chest is recommended for further evaluation to further delineate the
density at the right base and presence of any underlying persistent
infiltrate.

These results will be called to the ordering clinician or
representative by the Radiologist Assistant, and communication
documented in the PACS or [REDACTED].

## 2022-02-11 DIAGNOSIS — E782 Mixed hyperlipidemia: Secondary | ICD-10-CM | POA: Diagnosis not present

## 2022-02-11 DIAGNOSIS — R7301 Impaired fasting glucose: Secondary | ICD-10-CM | POA: Diagnosis not present

## 2022-02-16 DIAGNOSIS — E663 Overweight: Secondary | ICD-10-CM | POA: Diagnosis not present

## 2022-02-16 DIAGNOSIS — R7301 Impaired fasting glucose: Secondary | ICD-10-CM | POA: Diagnosis not present

## 2022-02-16 DIAGNOSIS — E782 Mixed hyperlipidemia: Secondary | ICD-10-CM | POA: Diagnosis not present

## 2022-02-16 DIAGNOSIS — Z1211 Encounter for screening for malignant neoplasm of colon: Secondary | ICD-10-CM | POA: Diagnosis not present

## 2022-02-16 DIAGNOSIS — I2510 Atherosclerotic heart disease of native coronary artery without angina pectoris: Secondary | ICD-10-CM | POA: Diagnosis not present

## 2022-02-16 DIAGNOSIS — K449 Diaphragmatic hernia without obstruction or gangrene: Secondary | ICD-10-CM | POA: Diagnosis not present

## 2022-02-16 DIAGNOSIS — F5221 Male erectile disorder: Secondary | ICD-10-CM | POA: Diagnosis not present

## 2022-02-16 DIAGNOSIS — I700 Atherosclerosis of aorta: Secondary | ICD-10-CM | POA: Diagnosis not present

## 2022-02-16 DIAGNOSIS — N401 Enlarged prostate with lower urinary tract symptoms: Secondary | ICD-10-CM | POA: Diagnosis not present

## 2022-03-10 IMAGING — DX DG CHEST 2V
2 series · 2 of 2 positions shown · non-contrast
Comparison: 07/06/2020.

CLINICAL DATA: Diaphragmatic hernia.

EXAM:
CHEST - 2 VIEW

[dg chest 2 view (1 of 2)]
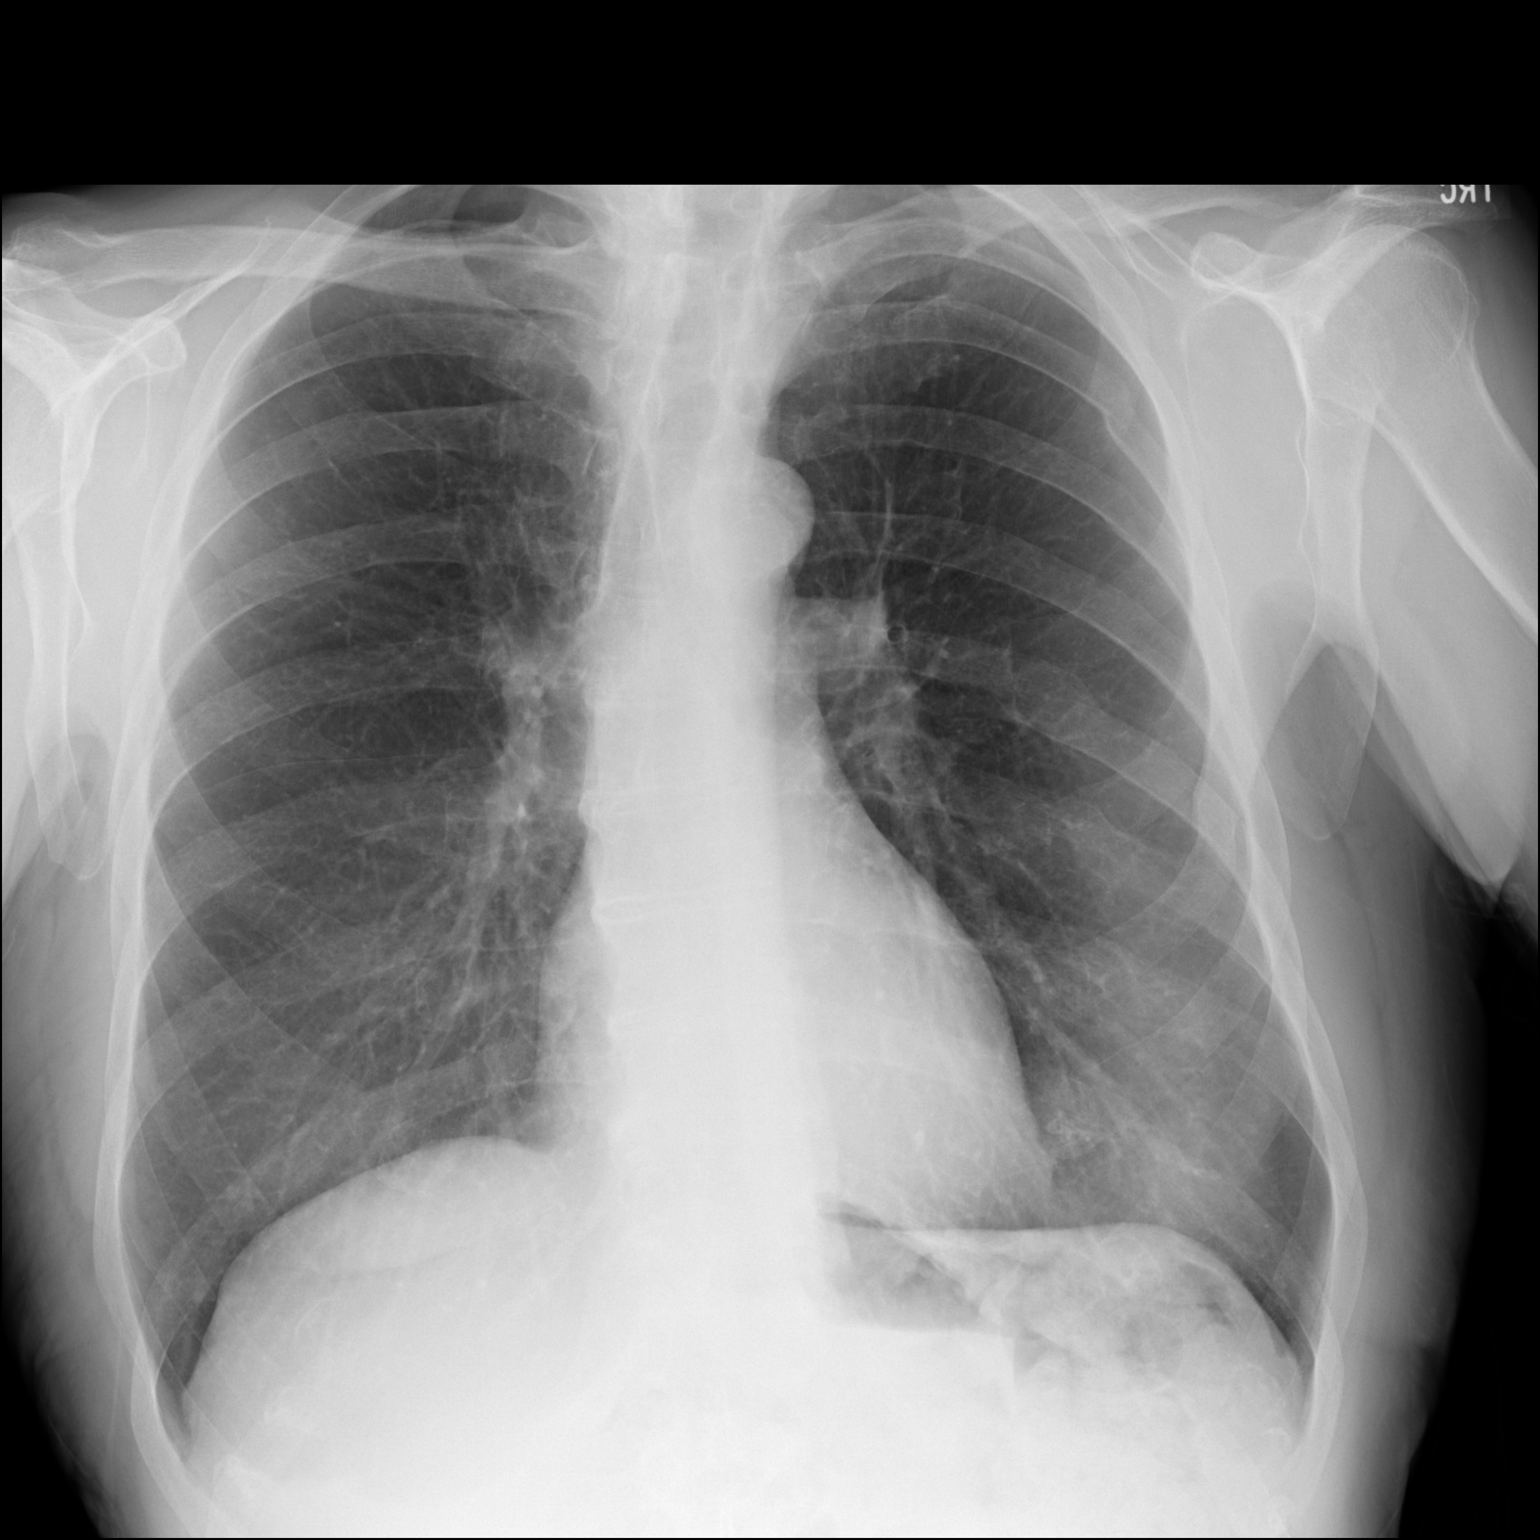

[dg chest 2 view (2 of 2)]
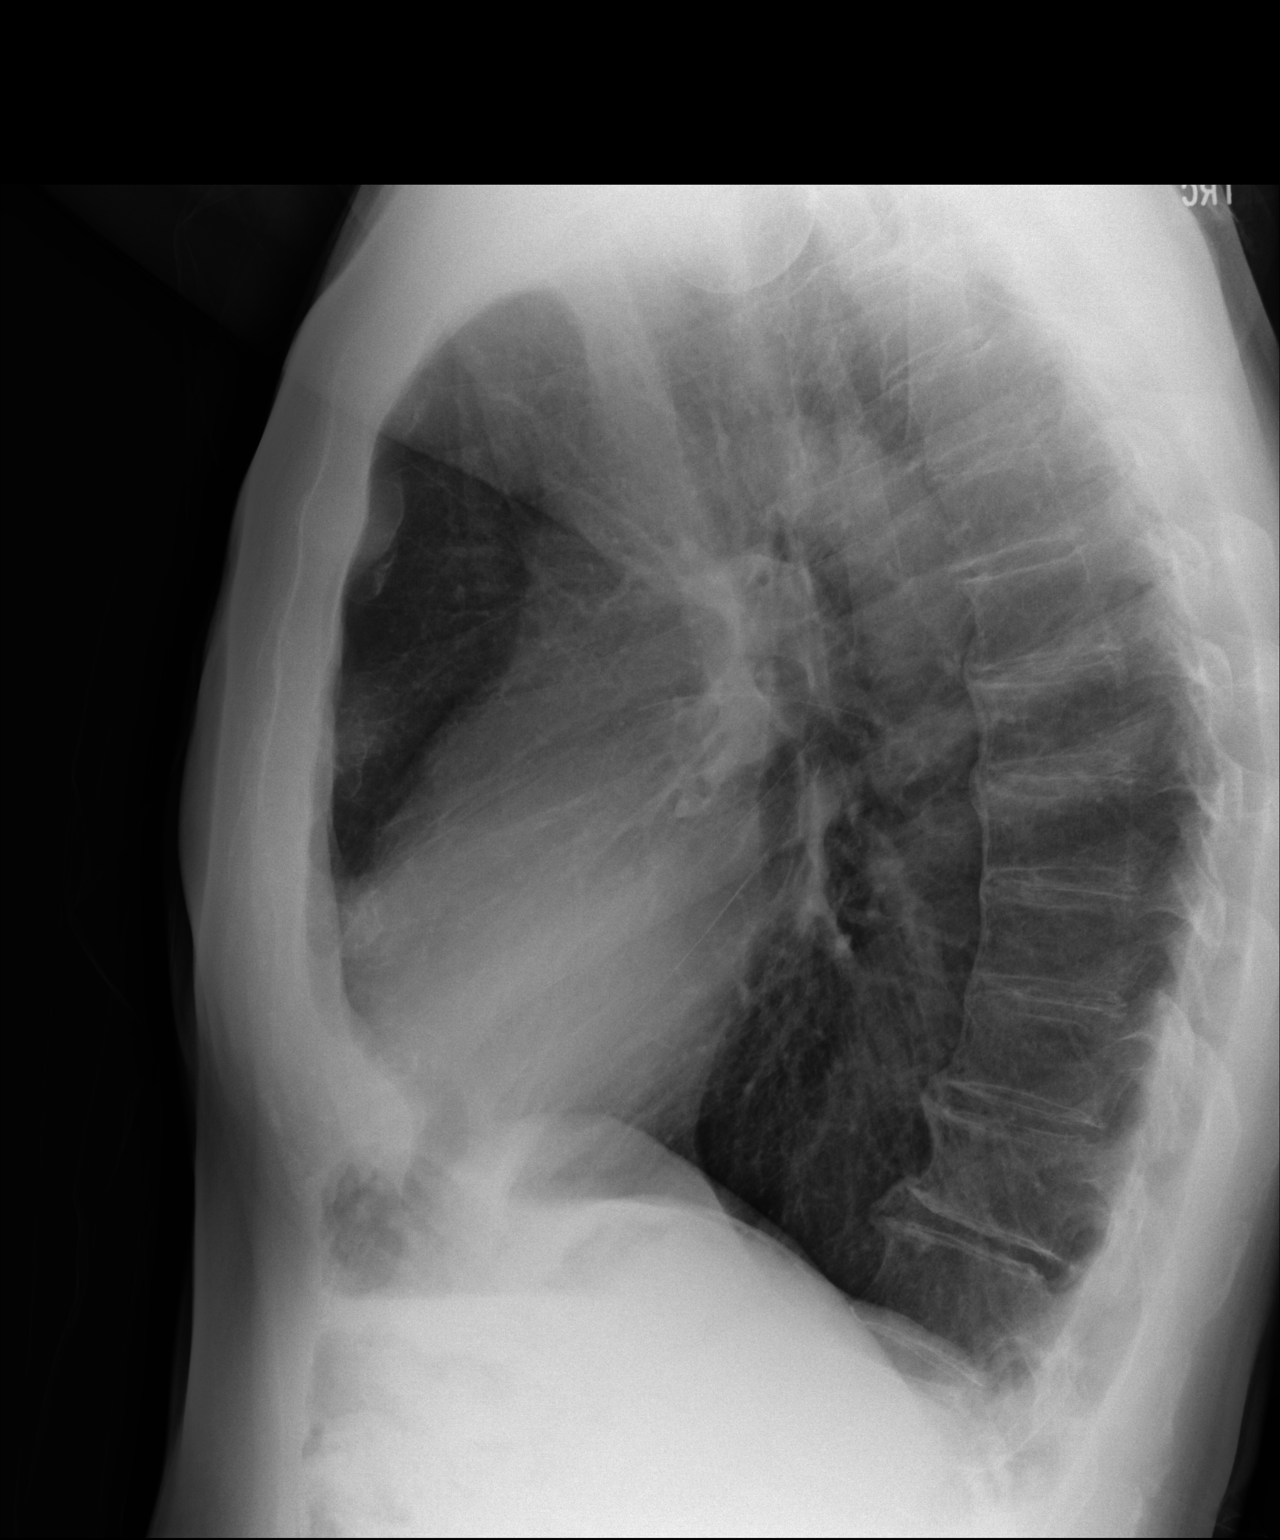

[2 of 2 positions shown; findings below may reference images not displayed]

FINDINGS: Interim removal of right chest tube. No pneumothorax. Mediastinum
and hilar structures normal. Heart size normal. No focal infiltrate.
Tiny nodular opacities in the lung bases most likely vessels on end.
No pleural effusion or pneumothorax. No evidence of hernia noted on
today's exam. Old left rib fractures noted.
IMPRESSION: Interim removal of right chest tube. No pneumothorax. No acute
cardiopulmonary disease.

## 2022-08-12 DIAGNOSIS — E782 Mixed hyperlipidemia: Secondary | ICD-10-CM | POA: Diagnosis not present

## 2022-08-12 DIAGNOSIS — R7301 Impaired fasting glucose: Secondary | ICD-10-CM | POA: Diagnosis not present

## 2022-08-18 DIAGNOSIS — I2510 Atherosclerotic heart disease of native coronary artery without angina pectoris: Secondary | ICD-10-CM | POA: Diagnosis not present

## 2022-08-18 DIAGNOSIS — R430 Anosmia: Secondary | ICD-10-CM | POA: Diagnosis not present

## 2022-08-18 DIAGNOSIS — F5221 Male erectile disorder: Secondary | ICD-10-CM | POA: Diagnosis not present

## 2022-08-18 DIAGNOSIS — E782 Mixed hyperlipidemia: Secondary | ICD-10-CM | POA: Diagnosis not present

## 2022-08-18 DIAGNOSIS — K449 Diaphragmatic hernia without obstruction or gangrene: Secondary | ICD-10-CM | POA: Diagnosis not present

## 2022-08-18 DIAGNOSIS — Z1211 Encounter for screening for malignant neoplasm of colon: Secondary | ICD-10-CM | POA: Diagnosis not present

## 2022-08-18 DIAGNOSIS — N401 Enlarged prostate with lower urinary tract symptoms: Secondary | ICD-10-CM | POA: Diagnosis not present

## 2022-08-18 DIAGNOSIS — R7301 Impaired fasting glucose: Secondary | ICD-10-CM | POA: Diagnosis not present

## 2022-08-18 DIAGNOSIS — I700 Atherosclerosis of aorta: Secondary | ICD-10-CM | POA: Diagnosis not present

## 2023-02-13 DIAGNOSIS — E782 Mixed hyperlipidemia: Secondary | ICD-10-CM | POA: Diagnosis not present

## 2023-02-13 DIAGNOSIS — R7301 Impaired fasting glucose: Secondary | ICD-10-CM | POA: Diagnosis not present

## 2023-02-17 DIAGNOSIS — Z0001 Encounter for general adult medical examination with abnormal findings: Secondary | ICD-10-CM | POA: Diagnosis not present

## 2023-02-17 DIAGNOSIS — N401 Enlarged prostate with lower urinary tract symptoms: Secondary | ICD-10-CM | POA: Diagnosis not present

## 2023-02-17 DIAGNOSIS — E782 Mixed hyperlipidemia: Secondary | ICD-10-CM | POA: Diagnosis not present

## 2023-02-17 DIAGNOSIS — I2510 Atherosclerotic heart disease of native coronary artery without angina pectoris: Secondary | ICD-10-CM | POA: Diagnosis not present

## 2023-02-17 DIAGNOSIS — F5221 Male erectile disorder: Secondary | ICD-10-CM | POA: Diagnosis not present

## 2023-02-17 DIAGNOSIS — I700 Atherosclerosis of aorta: Secondary | ICD-10-CM | POA: Diagnosis not present

## 2023-02-17 DIAGNOSIS — K449 Diaphragmatic hernia without obstruction or gangrene: Secondary | ICD-10-CM | POA: Diagnosis not present

## 2023-02-17 DIAGNOSIS — R7301 Impaired fasting glucose: Secondary | ICD-10-CM | POA: Diagnosis not present

## 2023-02-17 DIAGNOSIS — M21611 Bunion of right foot: Secondary | ICD-10-CM | POA: Diagnosis not present

## 2023-02-23 DIAGNOSIS — D2261 Melanocytic nevi of right upper limb, including shoulder: Secondary | ICD-10-CM | POA: Diagnosis not present

## 2023-02-23 DIAGNOSIS — L821 Other seborrheic keratosis: Secondary | ICD-10-CM | POA: Diagnosis not present

## 2023-02-23 DIAGNOSIS — D2262 Melanocytic nevi of left upper limb, including shoulder: Secondary | ICD-10-CM | POA: Diagnosis not present

## 2023-02-23 DIAGNOSIS — L814 Other melanin hyperpigmentation: Secondary | ICD-10-CM | POA: Diagnosis not present

## 2023-02-23 DIAGNOSIS — D225 Melanocytic nevi of trunk: Secondary | ICD-10-CM | POA: Diagnosis not present

## 2023-02-23 DIAGNOSIS — L570 Actinic keratosis: Secondary | ICD-10-CM | POA: Diagnosis not present

## 2023-04-04 DIAGNOSIS — R5381 Other malaise: Secondary | ICD-10-CM | POA: Diagnosis not present

## 2023-04-04 DIAGNOSIS — R430 Anosmia: Secondary | ICD-10-CM | POA: Diagnosis not present

## 2023-04-04 DIAGNOSIS — R070 Pain in throat: Secondary | ICD-10-CM | POA: Diagnosis not present

## 2023-04-04 DIAGNOSIS — U071 COVID-19: Secondary | ICD-10-CM | POA: Diagnosis not present

## 2023-04-04 DIAGNOSIS — J00 Acute nasopharyngitis [common cold]: Secondary | ICD-10-CM | POA: Diagnosis not present

## 2023-08-24 DIAGNOSIS — N401 Enlarged prostate with lower urinary tract symptoms: Secondary | ICD-10-CM | POA: Diagnosis not present

## 2023-08-24 DIAGNOSIS — E782 Mixed hyperlipidemia: Secondary | ICD-10-CM | POA: Diagnosis not present

## 2023-08-24 DIAGNOSIS — R7301 Impaired fasting glucose: Secondary | ICD-10-CM | POA: Diagnosis not present

## 2023-08-30 DIAGNOSIS — Z0001 Encounter for general adult medical examination with abnormal findings: Secondary | ICD-10-CM | POA: Diagnosis not present

## 2023-08-30 DIAGNOSIS — I2510 Atherosclerotic heart disease of native coronary artery without angina pectoris: Secondary | ICD-10-CM | POA: Diagnosis not present

## 2023-08-30 DIAGNOSIS — E782 Mixed hyperlipidemia: Secondary | ICD-10-CM | POA: Diagnosis not present

## 2023-08-30 DIAGNOSIS — N529 Male erectile dysfunction, unspecified: Secondary | ICD-10-CM | POA: Diagnosis not present

## 2023-08-30 DIAGNOSIS — K449 Diaphragmatic hernia without obstruction or gangrene: Secondary | ICD-10-CM | POA: Diagnosis not present

## 2023-08-30 DIAGNOSIS — N401 Enlarged prostate with lower urinary tract symptoms: Secondary | ICD-10-CM | POA: Diagnosis not present

## 2023-08-30 DIAGNOSIS — R7301 Impaired fasting glucose: Secondary | ICD-10-CM | POA: Diagnosis not present

## 2023-08-30 DIAGNOSIS — F5221 Male erectile disorder: Secondary | ICD-10-CM | POA: Diagnosis not present

## 2023-08-30 DIAGNOSIS — H6123 Impacted cerumen, bilateral: Secondary | ICD-10-CM | POA: Diagnosis not present

## 2023-09-29 ENCOUNTER — Encounter (INDEPENDENT_AMBULATORY_CARE_PROVIDER_SITE_OTHER): Payer: Self-pay | Admitting: Otolaryngology

## 2023-11-03 ENCOUNTER — Institutional Professional Consult (permissible substitution) (INDEPENDENT_AMBULATORY_CARE_PROVIDER_SITE_OTHER): Payer: Medicare PPO | Admitting: Otolaryngology

## 2023-11-06 ENCOUNTER — Ambulatory Visit (INDEPENDENT_AMBULATORY_CARE_PROVIDER_SITE_OTHER): Payer: Medicare PPO | Admitting: Otolaryngology

## 2023-11-06 ENCOUNTER — Encounter (INDEPENDENT_AMBULATORY_CARE_PROVIDER_SITE_OTHER): Payer: Self-pay | Admitting: Otolaryngology

## 2023-11-06 VITALS — BP 158/83 | HR 86 | Ht 73.0 in | Wt 190.0 lb

## 2023-11-06 DIAGNOSIS — J383 Other diseases of vocal cords: Secondary | ICD-10-CM

## 2023-11-06 DIAGNOSIS — R430 Anosmia: Secondary | ICD-10-CM

## 2023-11-06 DIAGNOSIS — J343 Hypertrophy of nasal turbinates: Secondary | ICD-10-CM | POA: Diagnosis not present

## 2023-11-06 DIAGNOSIS — R0981 Nasal congestion: Secondary | ICD-10-CM | POA: Diagnosis not present

## 2023-11-06 DIAGNOSIS — J3489 Other specified disorders of nose and nasal sinuses: Secondary | ICD-10-CM

## 2023-11-06 DIAGNOSIS — J342 Deviated nasal septum: Secondary | ICD-10-CM

## 2023-11-06 DIAGNOSIS — J3089 Other allergic rhinitis: Secondary | ICD-10-CM

## 2023-11-06 DIAGNOSIS — R490 Dysphonia: Secondary | ICD-10-CM | POA: Diagnosis not present

## 2023-11-06 MED ORDER — FLUTICASONE PROPIONATE 50 MCG/ACT NA SUSP
2.0000 | Freq: Two times a day (BID) | NASAL | 6 refills | Status: AC
Start: 2023-11-06 — End: ?

## 2023-11-06 MED ORDER — CETIRIZINE HCL 10 MG PO TABS: 10.0000 mg | ORAL_TABLET | Freq: Every day | ORAL | 11 refills | Status: AC

## 2023-11-06 NOTE — Patient Instructions (Addendum)
-   Debrox to left ear    Olfactory Training: Several times per day, place the following up to your nose and make attempts to smell them for at least 20 seconds at a time. Do this at least twice per day with each odorant. You may substitute or add other strong odorants if you prefer.You also may place them in a paper bag and pace the open end of the bag to your face to smell the odor. Lemon peel Rose Cloves Eucalyptus  I also have people try this as frequently as possible with other odorants (coffee, orange peel, etc).  Other ideas for anosmia: topical treatment with vitamin A, nasal steroid irrigations (not just spray), omega-3, topical sodium citrate  - take Zyrtec and do Flonase - do nasal saline irrigations   Luis Carr Med Nasal Saline Rinse   - start nasal saline rinses with NeilMed Bottle available over the counter or online to help with nasal congestion

## 2023-11-06 NOTE — Progress Notes (Signed)
 ENT CONSULT:  Reason for Consult: chronic anosmia  chronic hoarseness  HPI: Discussed the use of AI scribe software for clinical note transcription with the patient, who gave verbal consent to proceed.  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  He has experienced anosmia for 15 years and sought evaluation after learning about the potential impact of delayed treatment on recovery. He denies nasal congestion or environmental allergies to pollen and dust. A previously tried nasal spray was ineffective. He rarely perceives faint smells and cannot recall specific examples.  He also experiences hoarseness, which has been noticed by his wife and doctor. There is no history of acid reflux, neurologic conditions, or stroke. He has no difficulty swallowing and no shortness of breath. His taste sensation is present but not complete.  He mentions a three-month history of left ear stuffiness, described as a sensation similar to 'swimmer's ear' rather than hearing loss. He has not experienced any ear infections.  He underwent robotic surgery for a diaphragmatic hernia two years ago, which was discovered after an episode of coughing and spitting up sputum, leading to a pneumonia diagnosis. He was treated with antibiotics at home and was then scheduled for surgery.      Records Reviewed:  Thoracic Surgery note 06/26/20 Luis Carr 78 y.o. male presents for surgical evaluation of a right diaphragmatic hernia.  This was found incidentally.  In August of this year he was treated in the emergency department for a presumed pneumonia and chest x-ray showed a right-sided opacity.  He subsequently underwent a CT scan a month later and that showed right-sided diaphragmatic hernia with intestine herniating into his right thoracic space.  From a symptomatic standpoint he denies any chest or back pain.  He denies  any abdominal pain no constipation.  He also denies any nausea or vomiting.  He has no recent or remote history of any blunt or penetrating trauma outside of a ground-level fall that he experienced in 1994.  He states that he fell on his sacrum but required no surgical intervention for that.   From a respiratory standpoint he has had some increased sputum production this is persisted since his pneumonia.  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  I personally reviewed his scan from September 2021 as well as a CT scan from 2004.  On the 2004 scan there appeared to be a right-sided opacity consistent with omentum that was herniated through a potential diaphragmatic defect.  On the most recent scan of this year the hernia is much bigger with a good component of bowel herniating into his right chest.  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  He currently is asymptomatic but given the fact that this hernia is enlarging over the last 17 years I have recommended that he undergo a robotic assisted laparoscopy with the diaphragmatic hernia repair.  We discussed the potential of requiring mesh for the closure.  He would like some time to discuss surgical options with his wife.  He will call us back to let us know his final decision.       Past Medical History:  Diagnosis Date   Head injury    age 75- hasd a laceration, concussion - maybe   Headache    Pneumonia 2021    Past Surgical History:  Procedure Laterality Date   COLONOSCOPY     EYE SURGERY Bilateral    cataract   XI ROBOTIC ASSISTED REPAIR OF DIAPHRAGMATIC HERNIA N/A 07/06/2020   Procedure: XI ROBOTIC ASSISTED REPAIR OF DIAPHRAGMATIC HERNIA;  Surgeon: [name], MD;  Location: [facility] OR;  Service: Thoracic;  Laterality: N/A;    History reviewed. No pertinent family history.  Social History:  reports that he quit smoking about 48 years ago. His  smoking use included cigarettes. He started smoking about 58 years ago. He has never used smokeless tobacco. He reports that he does not currently use alcohol. He reports that he does not use drugs.  Allergies: No Known Allergies  Medications: I have reviewed the patient's current medications.  The PMH, PSH, Medications, Allergies, and SH were reviewed and updated.  ROS: Constitutional: Negative for fever, weight loss and weight gain. Cardiovascular: Negative for chest pain and dyspnea on exertion. Respiratory: Is not experiencing shortness of breath at rest. Gastrointestinal: Negative for nausea and vomiting. Neurological: Negative for headaches. Psychiatric: The patient is not nervous/anxious  Blood pressure (!) 158/83, pulse 86, height 6\' 1"  (1.854 m), weight 190 lb (86.2 kg), SpO2 97%. Body mass index is 25.07 kg/m.  PHYSICAL EXAM:  Exam: General: Well-developed, well-nourished Communication and Voice:raspy hoarse Respiratory Respiratory effort: Equal inspiration and expiration without stridor Cardiovascular Peripheral Vascular: Warm extremities with equal color/perfusion Eyes: No nystagmus with equal extraocular motion bilaterally Neuro/Psych/Balance: Patient oriented to person, place, and time; Appropriate mood and affect; Gait is intact with no imbalance; Cranial nerves I-XII are intact Head and Face Inspection: Normocephalic and atraumatic without mass or lesion Palpation: Facial skeleton intact without bony stepoffs Salivary Glands: No mass or tenderness Facial Strength: Facial motility symmetric and full bilaterally ENT Pinna: External ear intact and fully developed External canal: Canal is patent with intact skin, cerumen impaction L side Tympanic Membrane: Clear on the right, cerumen on the left side cannot see L TM External Nose: No scar or anatomic deformity Internal Nose: Septum is deviated and S-shaped. No polyp, or purulence. Mucosal edema and erythema  present.  Bilateral inferior turbinate hypertrophy.  Lips, Teeth, and gums: Mucosa and teeth intact and viable TMJ: No pain to palpation with full mobility Oral cavity/oropharynx: No erythema or exudate, no lesions present Nasopharynx: No mass or lesion with intact mucosa Hypopharynx: Intact mucosa without pooling of secretions Larynx Glottic: Full true vocal cord mobility without lesion or mass VF atrophy Supraglottic: Normal appearing epiglottis and AE folds Interarytenoid Space: Moderate pachydermia&edema Subglottic Space: Patent without lesion or edema Neck Neck and Trachea: Midline trachea without mass or lesion Thyroid: No mass or nodularity Lymphatics: No lymphadenopathy  Procedure: Preoperative diagnosis: dysphonia   Postoperative diagnosis:   Same  Procedure: Flexible fiberoptic laryngoscopy  Surgeon: Ashok Croon, MD  Anesthesia: Topical lidocaine and Afrin Complications: None Condition is stable throughout exam  Indications and consent:  The patient presents to the clinic with above symptoms. Indirect laryngoscopy view was incomplete. Thus it was recommended that they undergo a flexible fiberoptic laryngoscopy. All of the risks, benefits, and potential complications were reviewed with the patient preoperatively and verbal informed consent was obtained.  Procedure: The patient was seated upright in the clinic. Topical lidocaine and Afrin were applied to the nasal cavity. After adequate anesthesia had occurred, I then proceeded to pass the flexible telescope into the nasal cavity. The nasal cavity was patent without rhinorrhea or polyp. The nasopharynx was also patent without mass or lesion. The base of tongue  was visualized and was normal. There were no signs of pooling of secretions in the piriform sinuses. The true vocal folds were mobile bilaterally. There were no signs of glottic or supraglottic mucosal lesion or mass. There was moderate interarytenoid pachydermia  and post cricoid edema. The telescope was then slowly withdrawn and the patient tolerated the procedure throughout.    PROCEDURE NOTE: nasal endoscopy  Preoperative diagnosis: chronic nasal congestion poor sense of smell   Postoperative diagnosis: same  Procedure: Diagnostic nasal endoscopy (40981)  Surgeon: Ashok Croon, M.D.  Anesthesia: Topical lidocaine and Afrin  H&P REVIEW: The patient's history and physical were reviewed today prior to procedure. All medications were reviewed and updated as well. Complications: None Condition is stable throughout exam Indications and consent: The patient presents with symptoms of chronic sinusitis not responding to previous therapies. All the risks, benefits, and potential complications were reviewed with the patient preoperatively and informed consent was obtained. The time out was completed with confirmation of the correct procedure.   Procedure: The patient was seated upright in the clinic. Topical lidocaine and Afrin were applied to the nasal cavity. After adequate anesthesia had occurred, the rigid nasal endoscope was passed into the nasal cavity. The nasal mucosa, turbinates, septum, and sinus drainage pathways were visualized bilaterally. This revealed no purulence or significant secretions that might be cultured. There were no polyps or sites of significant inflammation. The mucosa was intact and there was no crusting present. The scope was then slowly withdrawn and the patient tolerated the procedure well. There were no complications or blood loss.    Studies Reviewed: CXR 08/14/20 FINDINGS: Interim removal of right chest tube. No pneumothorax. Mediastinum and hilar structures normal. Heart size normal. No focal infiltrate. Tiny nodular opacities in the lung bases most likely vessels on end. No pleural effusion or pneumothorax. No evidence of hernia noted on today's exam. Old left rib fractures noted.   IMPRESSION: Interim removal  of right chest tube. No pneumothorax. No acute cardiopulmonary disease.  Assessment/Plan: Encounter Diagnoses  Name Primary?   Dysphonia    Hoarseness    Age-related vocal fold atrophy    Glottic insufficiency    Environmental and seasonal allergies    Hypertrophy of both inferior nasal turbinates    Nasal septal deviation    Nasal obstruction    Nasal congestion    Anosmia Yes    Assessment and Plan    Chronic Anosmia Chronic anosmia for approximately 15 years. Septal deviation/Inferior turbinate hypertrophy./mucosal edema are noted on nasal endoscopy examination, which may contribute to anosmia. Differential diagnosis includes nasal congestion from allergies, medication side effects, normal aging, and chronic brain changes. Most likely cause is chronic nasal congestion from allergies. Discussed treatment options including olfactory retraining, nasal spray, and antihistamines. Outcomes vary, and improvement is not guaranteed. - Initiate olfactory retraining - instructions given  - Prescribe Flonase nasal spray twice daily 2 puffs b/l nares - Recommend Zyrtec 10 mg at night - Advised nasal irrigation with saline using NeilMed Bottle   Cerumen impaction left side  Left ear cerumen impaction causing a sensation of fullness. No signs of infection. Debrox ear drops will help soften the wax, and earwax removal will be scheduled. 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 No lesions or growths noted, and no VF paralysis. First-line treatment is  speech therapy, but he would like to hold off.  - No immediate intervention required - Monitor for any changes or worsening of symptoms  Follow-up - Schedule follow-up appointment in 3 weeks for earwax removal.      Thank  you for allowing me to participate in the care of this patient. Please do not hesitate to contact me with any questions or concerns.   Ashok Croon, MD Otolaryngology [facility] Phone: [phone] Fax: [phone]    11/06/2023, 9:06 PM

## 2023-12-06 ENCOUNTER — Ambulatory Visit (INDEPENDENT_AMBULATORY_CARE_PROVIDER_SITE_OTHER): Payer: Medicare PPO | Admitting: Otolaryngology

## 2024-02-22 DIAGNOSIS — R7301 Impaired fasting glucose: Secondary | ICD-10-CM | POA: Diagnosis not present

## 2024-02-22 DIAGNOSIS — E782 Mixed hyperlipidemia: Secondary | ICD-10-CM | POA: Diagnosis not present

## 2024-02-28 ENCOUNTER — Other Ambulatory Visit (HOSPITAL_COMMUNITY): Payer: Self-pay | Admitting: Internal Medicine

## 2024-02-28 DIAGNOSIS — I2510 Atherosclerotic heart disease of native coronary artery without angina pectoris: Secondary | ICD-10-CM | POA: Diagnosis not present

## 2024-02-28 DIAGNOSIS — N401 Enlarged prostate with lower urinary tract symptoms: Secondary | ICD-10-CM | POA: Diagnosis not present

## 2024-02-28 DIAGNOSIS — I700 Atherosclerosis of aorta: Secondary | ICD-10-CM | POA: Diagnosis not present

## 2024-02-28 DIAGNOSIS — F5221 Male erectile disorder: Secondary | ICD-10-CM | POA: Diagnosis not present

## 2024-02-28 DIAGNOSIS — N529 Male erectile dysfunction, unspecified: Secondary | ICD-10-CM | POA: Diagnosis not present

## 2024-02-28 DIAGNOSIS — E785 Hyperlipidemia, unspecified: Secondary | ICD-10-CM | POA: Diagnosis not present

## 2024-02-28 DIAGNOSIS — R222 Localized swelling, mass and lump, trunk: Secondary | ICD-10-CM

## 2024-02-28 DIAGNOSIS — R7301 Impaired fasting glucose: Secondary | ICD-10-CM | POA: Diagnosis not present

## 2024-02-28 DIAGNOSIS — E782 Mixed hyperlipidemia: Secondary | ICD-10-CM | POA: Diagnosis not present

## 2024-02-28 DIAGNOSIS — K449 Diaphragmatic hernia without obstruction or gangrene: Secondary | ICD-10-CM | POA: Diagnosis not present

## 2024-03-08 ENCOUNTER — Ambulatory Visit (HOSPITAL_COMMUNITY)
Admission: RE | Admit: 2024-03-08 | Discharge: 2024-03-08 | Disposition: A | Discharge status: Home / Self Care | Source: Ambulatory Visit | Attending: Internal Medicine | Admitting: Internal Medicine

## 2024-03-08 DIAGNOSIS — R222 Localized swelling, mass and lump, trunk: Secondary | ICD-10-CM | POA: Insufficient documentation

## 2024-03-08 DIAGNOSIS — R1903 Right lower quadrant abdominal swelling, mass and lump: Secondary | ICD-10-CM | POA: Diagnosis not present

## 2024-03-25 ENCOUNTER — Telehealth: Payer: Self-pay | Admitting: *Deleted

## 2024-03-25 NOTE — Telephone Encounter (Signed)
 Received referral from Dr. Christyne Hurst [phone]~ telephone/ [phone]~ fax.  Reason for referral: Hernia, Inguinal Left  Call placed to patient to schedule consult. Patient reports that he would prefer to see surgeon in [facility] and has asked PCP to send referral to [facility].

## 2024-04-10 ENCOUNTER — Other Ambulatory Visit: Payer: Self-pay | Admitting: General Surgery

## 2024-04-10 DIAGNOSIS — K432 Incisional hernia without obstruction or gangrene: Secondary | ICD-10-CM

## 2024-04-12 ENCOUNTER — Ambulatory Visit
Admission: RE | Admit: 2024-04-12 | Discharge: 2024-04-12 | Disposition: A | Discharge status: Home / Self Care | Source: Ambulatory Visit | Attending: General Surgery | Admitting: General Surgery

## 2024-04-12 DIAGNOSIS — K432 Incisional hernia without obstruction or gangrene: Secondary | ICD-10-CM

## 2024-04-12 DIAGNOSIS — R1901 Right upper quadrant abdominal swelling, mass and lump: Secondary | ICD-10-CM | POA: Diagnosis not present

## 2024-05-06 DIAGNOSIS — K432 Incisional hernia without obstruction or gangrene: Secondary | ICD-10-CM | POA: Diagnosis not present

## 2024-05-24 DIAGNOSIS — L821 Other seborrheic keratosis: Secondary | ICD-10-CM | POA: Diagnosis not present

## 2024-05-24 DIAGNOSIS — L578 Other skin changes due to chronic exposure to nonionizing radiation: Secondary | ICD-10-CM | POA: Diagnosis not present

## 2024-05-24 DIAGNOSIS — D2339 Other benign neoplasm of skin of other parts of face: Secondary | ICD-10-CM | POA: Diagnosis not present

## 2024-05-29 DIAGNOSIS — Z4889 Encounter for other specified surgical aftercare: Secondary | ICD-10-CM | POA: Diagnosis not present

## 2024-05-29 DIAGNOSIS — Z9889 Other specified postprocedural states: Secondary | ICD-10-CM | POA: Diagnosis not present

## 2024-07-10 DIAGNOSIS — E785 Hyperlipidemia, unspecified: Secondary | ICD-10-CM | POA: Diagnosis not present

## 2024-07-10 DIAGNOSIS — N182 Chronic kidney disease, stage 2 (mild): Secondary | ICD-10-CM | POA: Diagnosis not present

## 2024-07-10 DIAGNOSIS — Z85828 Personal history of other malignant neoplasm of skin: Secondary | ICD-10-CM | POA: Diagnosis not present

## 2024-07-10 DIAGNOSIS — Z8249 Family history of ischemic heart disease and other diseases of the circulatory system: Secondary | ICD-10-CM | POA: Diagnosis not present

## 2024-07-10 DIAGNOSIS — I700 Atherosclerosis of aorta: Secondary | ICD-10-CM | POA: Diagnosis not present

## 2024-07-10 DIAGNOSIS — N529 Male erectile dysfunction, unspecified: Secondary | ICD-10-CM | POA: Diagnosis not present

## 2024-07-10 DIAGNOSIS — N400 Enlarged prostate without lower urinary tract symptoms: Secondary | ICD-10-CM | POA: Diagnosis not present

## 2024-07-10 DIAGNOSIS — R32 Unspecified urinary incontinence: Secondary | ICD-10-CM | POA: Diagnosis not present

## 2024-07-10 DIAGNOSIS — I2510 Atherosclerotic heart disease of native coronary artery without angina pectoris: Secondary | ICD-10-CM | POA: Diagnosis not present
